# Patient Record
Sex: Male | Born: 1975 | Race: White | Hispanic: No | Smoking: Never smoker
Health system: Southern US, Community
[De-identification: ages and names within clinical notes are randomized; demographics above are authoritative.]

## PROBLEM LIST (undated history)

## (undated) DIAGNOSIS — T8612 Kidney transplant failure: Secondary | ICD-10-CM

## (undated) DIAGNOSIS — I10 Essential (primary) hypertension: Secondary | ICD-10-CM

## (undated) DIAGNOSIS — I509 Heart failure, unspecified: Secondary | ICD-10-CM

## (undated) HISTORY — PX: VALVE REPLACEMENT: SUR13

## (undated) HISTORY — PX: KIDNEY TRANSPLANT: SHX239

---

## 2020-05-09 ENCOUNTER — Emergency Department (HOSPITAL_COMMUNITY): Payer: Medicaid Other

## 2020-05-09 ENCOUNTER — Other Ambulatory Visit: Payer: Self-pay

## 2020-05-09 ENCOUNTER — Inpatient Hospital Stay (HOSPITAL_COMMUNITY)
Admission: EM | Admit: 2020-05-09 | Discharge: 2020-05-12 | DRG: 177 | Discharge status: Against Medical Advice | Payer: Medicaid Other | Attending: Internal Medicine | Admitting: Internal Medicine

## 2020-05-09 ENCOUNTER — Inpatient Hospital Stay (HOSPITAL_COMMUNITY): Payer: Medicaid Other

## 2020-05-09 ENCOUNTER — Encounter (HOSPITAL_COMMUNITY): Payer: Self-pay | Admitting: Emergency Medicine

## 2020-05-09 DIAGNOSIS — Q8781 Alport syndrome: Secondary | ICD-10-CM | POA: Diagnosis not present

## 2020-05-09 DIAGNOSIS — T380X5A Adverse effect of glucocorticoids and synthetic analogues, initial encounter: Secondary | ICD-10-CM | POA: Diagnosis not present

## 2020-05-09 DIAGNOSIS — I509 Heart failure, unspecified: Secondary | ICD-10-CM | POA: Diagnosis present

## 2020-05-09 DIAGNOSIS — N250 Renal osteodystrophy: Secondary | ICD-10-CM | POA: Diagnosis present

## 2020-05-09 DIAGNOSIS — Z825 Family history of asthma and other chronic lower respiratory diseases: Secondary | ICD-10-CM

## 2020-05-09 DIAGNOSIS — N179 Acute kidney failure, unspecified: Secondary | ICD-10-CM

## 2020-05-09 DIAGNOSIS — Z7901 Long term (current) use of anticoagulants: Secondary | ICD-10-CM

## 2020-05-09 DIAGNOSIS — Z5329 Procedure and treatment not carried out because of patient's decision for other reasons: Secondary | ICD-10-CM | POA: Diagnosis not present

## 2020-05-09 DIAGNOSIS — T8619 Other complication of kidney transplant: Secondary | ICD-10-CM | POA: Diagnosis present

## 2020-05-09 DIAGNOSIS — R739 Hyperglycemia, unspecified: Secondary | ICD-10-CM | POA: Diagnosis not present

## 2020-05-09 DIAGNOSIS — E875 Hyperkalemia: Secondary | ICD-10-CM | POA: Diagnosis present

## 2020-05-09 DIAGNOSIS — I132 Hypertensive heart and chronic kidney disease with heart failure and with stage 5 chronic kidney disease, or end stage renal disease: Secondary | ICD-10-CM | POA: Diagnosis present

## 2020-05-09 DIAGNOSIS — D631 Anemia in chronic kidney disease: Secondary | ICD-10-CM | POA: Diagnosis present

## 2020-05-09 DIAGNOSIS — E872 Acidosis: Secondary | ICD-10-CM | POA: Diagnosis present

## 2020-05-09 DIAGNOSIS — R569 Unspecified convulsions: Secondary | ICD-10-CM | POA: Diagnosis present

## 2020-05-09 DIAGNOSIS — J1282 Pneumonia due to coronavirus disease 2019: Secondary | ICD-10-CM | POA: Diagnosis present

## 2020-05-09 DIAGNOSIS — Z8711 Personal history of peptic ulcer disease: Secondary | ICD-10-CM

## 2020-05-09 DIAGNOSIS — N189 Chronic kidney disease, unspecified: Secondary | ICD-10-CM

## 2020-05-09 DIAGNOSIS — H905 Unspecified sensorineural hearing loss: Secondary | ICD-10-CM | POA: Diagnosis present

## 2020-05-09 DIAGNOSIS — Z888 Allergy status to other drugs, medicaments and biological substances status: Secondary | ICD-10-CM | POA: Diagnosis not present

## 2020-05-09 DIAGNOSIS — Y92239 Unspecified place in hospital as the place of occurrence of the external cause: Secondary | ICD-10-CM | POA: Diagnosis not present

## 2020-05-09 DIAGNOSIS — N186 End stage renal disease: Secondary | ICD-10-CM | POA: Diagnosis present

## 2020-05-09 DIAGNOSIS — Y830 Surgical operation with transplant of whole organ as the cause of abnormal reaction of the patient, or of later complication, without mention of misadventure at the time of the procedure: Secondary | ICD-10-CM | POA: Diagnosis present

## 2020-05-09 DIAGNOSIS — Z7982 Long term (current) use of aspirin: Secondary | ICD-10-CM

## 2020-05-09 DIAGNOSIS — Z79899 Other long term (current) drug therapy: Secondary | ICD-10-CM

## 2020-05-09 DIAGNOSIS — R0602 Shortness of breath: Secondary | ICD-10-CM

## 2020-05-09 DIAGNOSIS — Z952 Presence of prosthetic heart valve: Secondary | ICD-10-CM | POA: Diagnosis not present

## 2020-05-09 DIAGNOSIS — U071 COVID-19: Principal | ICD-10-CM | POA: Diagnosis present

## 2020-05-09 DIAGNOSIS — D649 Anemia, unspecified: Secondary | ICD-10-CM

## 2020-05-09 DIAGNOSIS — J9601 Acute respiratory failure with hypoxia: Secondary | ICD-10-CM | POA: Diagnosis present

## 2020-05-09 HISTORY — DX: Kidney transplant failure: T86.12

## 2020-05-09 HISTORY — DX: Heart failure, unspecified: I50.9

## 2020-05-09 HISTORY — DX: Essential (primary) hypertension: I10

## 2020-05-09 LAB — COMPREHENSIVE METABOLIC PANEL
ALT: 12 U/L (ref 0–44)
AST: 13 U/L — ABNORMAL LOW (ref 15–41)
Albumin: 3.5 g/dL (ref 3.5–5.0)
Alkaline Phosphatase: 36 U/L — ABNORMAL LOW (ref 38–126)
Anion gap: 13 (ref 5–15)
BUN: 90 mg/dL — ABNORMAL HIGH (ref 6–20)
CO2: 15 mmol/L — ABNORMAL LOW (ref 22–32)
Calcium: 7.3 mg/dL — ABNORMAL LOW (ref 8.9–10.3)
Chloride: 112 mmol/L — ABNORMAL HIGH (ref 98–111)
Creatinine, Ser: 3.73 mg/dL — ABNORMAL HIGH (ref 0.61–1.24)
GFR, Estimated: 20 mL/min — ABNORMAL LOW (ref >60)
Glucose, Bld: 103 mg/dL — ABNORMAL HIGH (ref 70–99)
Potassium: 5 mmol/L (ref 3.5–5.1)
Sodium: 140 mmol/L (ref 135–145)
Total Bilirubin: 0.9 mg/dL (ref 0.3–1.2)
Total Protein: 6.4 g/dL — ABNORMAL LOW (ref 6.5–8.1)

## 2020-05-09 LAB — ABO/RH: ABO/RH(D): A NEG

## 2020-05-09 LAB — CBC WITH DIFFERENTIAL/PLATELET
Abs Immature Granulocytes: 0.01 10*3/uL (ref 0.00–0.07)
Basophils Absolute: 0 10*3/uL (ref 0.0–0.1)
Basophils Relative: 0 %
Eosinophils Absolute: 0.3 10*3/uL (ref 0.0–0.5)
Eosinophils Relative: 6 %
HCT: 22.2 % — ABNORMAL LOW (ref 39.0–52.0)
Hemoglobin: 7.1 g/dL — ABNORMAL LOW (ref 13.0–17.0)
Immature Granulocytes: 0 %
Lymphocytes Relative: 12 %
Lymphs Abs: 0.6 10*3/uL — ABNORMAL LOW (ref 0.7–4.0)
MCH: 30.7 pg (ref 26.0–34.0)
MCHC: 32 g/dL (ref 30.0–36.0)
MCV: 96.1 fL (ref 80.0–100.0)
Monocytes Absolute: 0.5 10*3/uL (ref 0.1–1.0)
Monocytes Relative: 10 %
Neutro Abs: 3.7 10*3/uL (ref 1.7–7.7)
Neutrophils Relative %: 72 %
Platelets: 106 10*3/uL — ABNORMAL LOW (ref 150–400)
RBC: 2.31 MIL/uL — ABNORMAL LOW (ref 4.22–5.81)
RDW: 15.4 % (ref 11.5–15.5)
WBC: 5.2 10*3/uL (ref 4.0–10.5)
nRBC: 0 % (ref 0.0–0.2)

## 2020-05-09 LAB — PROCALCITONIN: Procalcitonin: 1.68 ng/mL

## 2020-05-09 LAB — RESP PANEL BY RT-PCR (FLU A&B, COVID) ARPGX2
Influenza A by PCR: NEGATIVE
Influenza B by PCR: NEGATIVE
SARS Coronavirus 2 by RT PCR: POSITIVE — AB

## 2020-05-09 LAB — FIBRINOGEN: Fibrinogen: 450 mg/dL (ref 210–475)

## 2020-05-09 LAB — TRIGLYCERIDES: Triglycerides: 80 mg/dL (ref <150)

## 2020-05-09 LAB — LACTATE DEHYDROGENASE: LDH: 258 U/L — ABNORMAL HIGH (ref 98–192)

## 2020-05-09 LAB — HIV ANTIBODY (ROUTINE TESTING W REFLEX): HIV Screen 4th Generation wRfx: NONREACTIVE

## 2020-05-09 LAB — PREPARE RBC (CROSSMATCH)

## 2020-05-09 LAB — LACTIC ACID, PLASMA: Lactic Acid, Venous: 0.9 mmol/L (ref 0.5–1.9)

## 2020-05-09 LAB — PROTIME-INR
INR: 4.4 (ref 0.8–1.2)
Prothrombin Time: 40.4 seconds — ABNORMAL HIGH (ref 11.4–15.2)

## 2020-05-09 LAB — D-DIMER, QUANTITATIVE: D-Dimer, Quant: 0.67 ug/mL-FEU — ABNORMAL HIGH (ref 0.00–0.50)

## 2020-05-09 LAB — FERRITIN: Ferritin: 449 ng/mL — ABNORMAL HIGH (ref 24–336)

## 2020-05-09 LAB — C-REACTIVE PROTEIN: CRP: 5.2 mg/dL — ABNORMAL HIGH (ref <1.0)

## 2020-05-09 MED ORDER — SODIUM CHLORIDE 0.9 % IV SOLN: 250.0000 mL | INTRAVENOUS | Status: DC | PRN

## 2020-05-09 MED ORDER — OXYCODONE HCL 5 MG PO TABS: 5.0000 mg | ORAL_TABLET | Freq: Three times a day (TID) | ORAL | Status: DC | PRN

## 2020-05-09 MED ORDER — BUMETANIDE 1 MG PO TABS: 1.0000 mg | ORAL_TABLET | Freq: Two times a day (BID) | ORAL | Status: DC

## 2020-05-09 MED ORDER — ASPIRIN EC 81 MG PO TBEC
81.0000 mg | DELAYED_RELEASE_TABLET | Freq: Every day | ORAL | Status: DC
  Administered 2020-05-09 – 2020-05-12 (×4): 81 mg via ORAL
  Filled 2020-05-09 (×4): qty 1

## 2020-05-09 MED ORDER — TACROLIMUS 1 MG PO CAPS
3.0000 mg | ORAL_CAPSULE | Freq: Two times a day (BID) | ORAL | Status: DC
  Administered 2020-05-09 – 2020-05-12 (×6): 3 mg via ORAL
  Filled 2020-05-09 (×6): qty 3

## 2020-05-09 MED ORDER — PANTOPRAZOLE SODIUM 20 MG PO TBEC
20.0000 mg | DELAYED_RELEASE_TABLET | Freq: Every day | ORAL | Status: DC
  Administered 2020-05-09 – 2020-05-12 (×4): 20 mg via ORAL
  Filled 2020-05-09 (×4): qty 1

## 2020-05-09 MED ORDER — ACETAMINOPHEN 325 MG PO TABS
650.0000 mg | ORAL_TABLET | Freq: Four times a day (QID) | ORAL | Status: DC | PRN
  Administered 2020-05-09 – 2020-05-10 (×3): 650 mg via ORAL
  Filled 2020-05-09 (×3): qty 2

## 2020-05-09 MED ORDER — SODIUM CHLORIDE 0.9 % IV SOLN: INTRAVENOUS | Status: DC

## 2020-05-09 MED ORDER — GUAIFENESIN-DM 100-10 MG/5ML PO SYRP: 10.0000 mL | ORAL_SOLUTION | ORAL | Status: DC | PRN

## 2020-05-09 MED ORDER — ATORVASTATIN CALCIUM 40 MG PO TABS
40.0000 mg | ORAL_TABLET | Freq: Every day | ORAL | Status: DC
  Administered 2020-05-09 – 2020-05-12 (×4): 40 mg via ORAL
  Filled 2020-05-09 (×4): qty 1

## 2020-05-09 MED ORDER — SODIUM CHLORIDE 0.9% IV SOLUTION: Freq: Once | INTRAVENOUS | Status: AC

## 2020-05-09 MED ORDER — SODIUM CHLORIDE 0.9 % IV SOLN
100.0000 mg | Freq: Every day | INTRAVENOUS | Status: DC
  Administered 2020-05-10 – 2020-05-12 (×3): 100 mg via INTRAVENOUS
  Filled 2020-05-09 (×4): qty 20

## 2020-05-09 MED ORDER — SODIUM CHLORIDE 0.9% FLUSH: 3.0000 mL | Freq: Two times a day (BID) | INTRAVENOUS | Status: DC

## 2020-05-09 MED ORDER — LEVETIRACETAM 500 MG PO TABS
500.0000 mg | ORAL_TABLET | Freq: Two times a day (BID) | ORAL | Status: DC
  Administered 2020-05-09 – 2020-05-12 (×6): 500 mg via ORAL
  Filled 2020-05-09 (×6): qty 1

## 2020-05-09 MED ORDER — SODIUM CHLORIDE 0.9 % IV SOLN
200.0000 mg | Freq: Once | INTRAVENOUS | Status: AC
  Administered 2020-05-09: 200 mg via INTRAVENOUS
  Filled 2020-05-09: qty 40

## 2020-05-09 MED ORDER — CARVEDILOL 12.5 MG PO TABS
12.5000 mg | ORAL_TABLET | Freq: Two times a day (BID) | ORAL | Status: DC
  Administered 2020-05-09 – 2020-05-12 (×6): 12.5 mg via ORAL
  Filled 2020-05-09 (×6): qty 1

## 2020-05-09 MED ORDER — ACETAMINOPHEN 500 MG PO TABS
1000.0000 mg | ORAL_TABLET | Freq: Once | ORAL | Status: AC
  Administered 2020-05-09: 1000 mg via ORAL
  Filled 2020-05-09: qty 2

## 2020-05-09 MED ORDER — WARFARIN - PHARMACIST DOSING INPATIENT: Freq: Every day | Status: DC

## 2020-05-09 MED ORDER — ONDANSETRON HCL 4 MG/2ML IJ SOLN
4.0000 mg | Freq: Four times a day (QID) | INTRAMUSCULAR | Status: AC | PRN
  Administered 2020-05-09 – 2020-05-10 (×3): 4 mg via INTRAVENOUS
  Filled 2020-05-09 (×2): qty 2

## 2020-05-09 MED ORDER — OXYCODONE HCL 5 MG PO TABS
5.0000 mg | ORAL_TABLET | Freq: Four times a day (QID) | ORAL | Status: DC | PRN
  Administered 2020-05-09 – 2020-05-10 (×4): 5 mg via ORAL
  Filled 2020-05-09 (×4): qty 1

## 2020-05-09 MED ORDER — CALCITRIOL 0.25 MCG PO CAPS
0.5000 ug | ORAL_CAPSULE | Freq: Every day | ORAL | Status: DC
  Administered 2020-05-09 – 2020-05-12 (×4): 0.5 ug via ORAL
  Filled 2020-05-09 (×2): qty 2
  Filled 2020-05-09: qty 1
  Filled 2020-05-09: qty 2

## 2020-05-09 MED ORDER — HYDROCOD POLST-CPM POLST ER 10-8 MG/5ML PO SUER: 5.0000 mL | Freq: Two times a day (BID) | ORAL | Status: DC | PRN

## 2020-05-09 MED ORDER — DEXAMETHASONE 6 MG PO TABS
6.0000 mg | ORAL_TABLET | ORAL | Status: DC
  Administered 2020-05-09 – 2020-05-11 (×3): 6 mg via ORAL
  Filled 2020-05-09 (×2): qty 1
  Filled 2020-05-09: qty 2

## 2020-05-09 MED ORDER — SODIUM CHLORIDE 0.9% FLUSH: 3.0000 mL | INTRAVENOUS | Status: DC | PRN

## 2020-05-09 NOTE — Consult Note (Signed)
Reason for Consult: Transplant kidney and renal failure Referring Physician: Dr. Joni Reining  Chief Complaint: Shortness of breath and body aches  Assessment/Plan: 1. AKI? 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 Dolores Frame. He reports that he's had fairly high cr but does not remember baseline cr. He believes it was in the 3's and actually had Bumex started bec of fluid retention. Doesn't appear that he's on Imuran or Cellcept and I wonder if he's had CMV or polyoma. - I've called [facility] and had the oncall nephrologist paged; will call again in the AM. - But for now I would fluid resuscitate with NS. - Continue steroids and Prograf; will check a tac level. - Urine studies. - Hold the Bumex. - Transplant renal ultrasound. 2. 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 3. Anemia - Transfuse as needed; no IV iron with active infection. 4. COVID PNA - per primary team. 5. H/o AVR   HPI: Wesley Bell is an 44 y.o. male with a history of renal failure currently on his 3rd transplant from [facility]) with the 1st and 3rd being DDRT and the 2nd a living transplant from his ex-wife. He was on dialysis since age 5 and also between the transplants with the the 2nd one in 2006 and the current one 5 years ago. He was on dialysis transiently after the 3rd transplant but does not report any history of rejection. He is compliant with his medications and is on prednisone 5mg , prograf 3mg  BID, and was also started on Bumex when he was retaining fluid. He has history of HTN, CHF and aortic valve replacement because of an aneurysm. 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 He has had a poor appetite as well + migraine headaches but has not noted decreased urine output, dysuria, nausea, diarrhea, hematuria.  ROS Pertinent items are noted in  HPI.  Chemistry and CBC: Creatinine, Ser  Date/Time Value Ref Range Status  05/09/2020 09:52 AM 3.73 (H) 0.61 - 1.24 mg/dL Final   Recent Labs  Lab 05/09/20 0952  NA 140  K 5.0  CL 112*  CO2 15*  GLUCOSE 103*  BUN 90*  CREATININE 3.73*  CALCIUM 7.3*   Recent Labs  Lab 05/09/20 0952  WBC 5.2  NEUTROABS 3.7  HGB 7.1*  HCT 22.2*  MCV 96.1  PLT 106*   Liver Function Tests: Recent Labs  Lab 05/09/20 0952  AST 13*  ALT 12  ALKPHOS 36*  BILITOT 0.9  PROT 6.4*  ALBUMIN 3.5   No results for input(s): LIPASE, AMYLASE in the last 168 hours. No results for input(s): AMMONIA in the last 168 hours. Cardiac Enzymes: No results for input(s): CKTOTAL, CKMB, CKMBINDEX, TROPONINI in the last 168 hours. Iron Studies:  Recent Labs    05/09/20 1255  FERRITIN 449*   PT/INR: @LABRCNTIP (inr:5)  Xrays/Other Studies: ) Results for orders placed or performed during the hospital encounter of 05/09/20 (from the past 48 hour(s))  Resp Panel by RT-PCR (Flu A&B, Covid) Nasopharyngeal Swab     Status: Abnormal   Collection Time: 05/09/20  9:51 AM   Specimen: Nasopharyngeal Swab; Nasopharyngeal(NP) swabs in vial transport medium  Result Value Ref Range   SARS Coronavirus 2 by RT PCR POSITIVE (A) NEGATIVE    Comment: emailed L. Kimball RN 11:45 05/09/20 ([email or web address]) (NOTE)  SARS-CoV-2 target nucleic acids are DETECTED.  The SARS-CoV-2 RNA is generally detectable in upper respiratory specimens during the acute phase of infection. Positive results are indicative of the presence of the identified virus, but do not rule out bacterial infection or co-infection with other pathogens not detected by the test. Clinical correlation with patient history and other diagnostic information is necessary to determine patient infection status. The expected result is Negative.  Fact Sheet for Patients: EntrepreneurPulse.com.au  Fact Sheet for Healthcare  Providers: IncredibleEmployment.be  This test is not yet approved or cleared by the [place] FDA and  has been authorized for detection and/or diagnosis of SARS-CoV-2 by FDA under an Emergency Use Authorization (EUA).  This EUA will remain in effect (meaning this test can be used) for the duration of  the COVID-1 9 declaration under Section 564(b)(1) of the Act, 21 U.S.C. section 360bbb-3(b)(1), unless the authorization is terminated or revoked sooner.     Influenza A by PCR NEGATIVE NEGATIVE   Influenza B by PCR NEGATIVE NEGATIVE    Comment: (NOTE) The Xpert Xpress SARS-CoV-2/FLU/RSV plus assay is intended as an aid in the diagnosis of influenza from Nasopharyngeal swab specimens and should not be used as a sole basis for treatment. Nasal washings and aspirates are unacceptable for Xpert Xpress SARS-CoV-2/FLU/RSV testing.  Fact Sheet for Patients: EntrepreneurPulse.com.au  Fact Sheet for Healthcare Providers: IncredibleEmployment.be  This test is not yet approved or cleared by the [place] FDA and has been authorized for detection and/or diagnosis of SARS-CoV-2 by FDA under an Emergency Use Authorization (EUA). This EUA will remain in effect (meaning this test can be used) for the duration of the COVID-19 declaration under Section 564(b)(1) of the Act, 21 U.S.C. section 360bbb-3(b)(1), unless the authorization is terminated or revoked.  Performed at [facility], [place] [address], [place], [place] [address]   Lactic acid, plasma     Status: None   Collection Time: 05/09/20  9:52 AM  Result Value Ref Range   Lactic Acid, Venous 0.9 0.5 - 1.9 mmol/L    Comment: Performed at [facility] [address] [place], [place] [address]  Comprehensive metabolic panel     Status: Abnormal   Collection Time: 05/09/20  9:52 AM  Result Value Ref Range   Sodium 140 135 - 145 mmol/L   Potassium 5.0 3.5 - 5.1  mmol/L   Chloride 112 (H) 98 - 111 mmol/L   CO2 15 (L) 22 - 32 mmol/L   Glucose, Bld 103 (H) 70 - 99 mg/dL    Comment: Glucose reference range applies only to samples taken after fasting for at least 8 hours.   BUN 90 (H) 6 - 20 mg/dL   Creatinine, Ser 3.73 (H) 0.61 - 1.24 mg/dL   Calcium 7.3 (L) 8.9 - 10.3 mg/dL   Total Protein 6.4 (L) 6.5 - 8.1 g/dL   Albumin 3.5 3.5 - 5.0 g/dL   AST 13 (L) 15 - 41 U/L   ALT 12 0 - 44 U/L   Alkaline Phosphatase 36 (L) 38 - 126 U/L   Total Bilirubin 0.9 0.3 - 1.2 mg/dL   GFR, Estimated 20 (L) >60 mL/min    Comment: (NOTE) Calculated using the CKD-EPI Creatinine Equation (2021)    Anion gap 13 5 - 15    Comment: Performed at [facility], [place] [address], [place], [place] [address]  CBC with Differential     Status: Abnormal   Collection Time: 05/09/20  9:52 AM  Result Value Ref Range   WBC 5.2 4.0 - 10.5 K/uL   RBC 2.31 (L) 4.22 - 5.81 MIL/uL   Hemoglobin 7.1 (L) 13.0 - 17.0 g/dL   HCT 22.2 (L) 39 - 52 %   MCV 96.1 80.0 - 100.0 fL   MCH 30.7 26.0 - 34.0 pg   MCHC 32.0 30.0 - 36.0 g/dL   RDW 15.4 11.5 - 15.5 %   Platelets 106 (L) 150 - 400 K/uL    Comment: REPEATED TO VERIFY PLATELET COUNT CONFIRMED BY SMEAR Immature Platelet Fraction may be clinically indicated, consider ordering this additional test [id number]    nRBC 0.0 0.0 - 0.2 %   Neutrophils Relative % 72 %   Neutro Abs 3.7 1.7 - 7.7 K/uL   Lymphocytes Relative 12 %   Lymphs Abs 0.6 (L) 0.7 - 4.0 K/uL   Monocytes Relative 10 %   Monocytes Absolute 0.5 0.1 - 1.0 K/uL   Eosinophils Relative 6 %   Eosinophils Absolute 0.3 0.0 - 0.5 K/uL   Basophils Relative 0 %   Basophils Absolute 0.0 0.0 - 0.1 K/uL   Immature Granulocytes 0 %   Abs Immature Granulocytes 0.01 0.00 - 0.07 K/uL   Schistocytes PRESENT     Comment: Performed at [facility], [place] [address], [place], [place] [address]  ABO/Rh     Status: None   Collection Time: 05/09/20  9:52 AM  Result Value Ref  Range   ABO/RH(D)      A NEG Performed at [facility] [address], [place], [place] [address]   D-dimer, quantitative     Status: Abnormal   Collection Time: 05/09/20 12:55 PM  Result Value Ref Range   D-Dimer, Quant 0.67 (H) 0.00 - 0.50 ug/mL-FEU    Comment: (NOTE) At the manufacturer cut-off value of 0.5 g/mL FEU, this assay has a negative predictive value of 95-100%.This assay is intended for use in conjunction with a clinical pretest probability (PTP) assessment model to exclude pulmonary embolism (PE) and deep venous thrombosis (DVT) in outpatients suspected of PE or DVT. Results should be correlated with clinical presentation. Performed at [facility], [place] [address] [place], [place] [address] CORRECTED ON 12/07 AT 1444: PREVIOUSLY REPORTED AS 0.67   Procalcitonin     Status: None   Collection Time: 05/09/20 12:55 PM  Result Value Ref Range   Procalcitonin 1.68 ng/mL    Comment:        Interpretation: PCT > 0.5 ng/mL and <= 2 ng/mL: Systemic infection (sepsis) is possible, but other conditions are known to elevate PCT as well. (NOTE)       Sepsis PCT Algorithm           Lower Respiratory Tract                                      Infection PCT Algorithm    ----------------------------     ----------------------------         PCT < 0.25 ng/mL                PCT < 0.10 ng/mL          Strongly encourage             Strongly discourage   discontinuation of antibiotics    initiation of antibiotics    ----------------------------     -----------------------------  PCT 0.25 - 0.50 ng/mL            PCT 0.10 - 0.25 ng/mL               OR       >80% decrease in PCT            Discourage initiation of                                            antibiotics      Encourage discontinuation           of antibiotics    ----------------------------     -----------------------------         PCT >= 0.50 ng/mL              PCT 0.26 - 0.50 ng/mL                 AND       <80% decrease in PCT             Encourage initiation of                                             antibiotics       Encourage continuation           of antibiotics    ----------------------------     -----------------------------        PCT >= 0.50 ng/mL                  PCT > 0.50 ng/mL               AND         increase in PCT                  Strongly encourage                                      initiation of antibiotics    Strongly encourage escalation           of antibiotics                                     -----------------------------                                           PCT <= 0.25 ng/mL                                                 OR                                        > 80% decrease in PCT  Discontinue / Do not initiate                                             antibiotics  Performed at [facility], [place] [address], [place], [place] [address]   Lactate dehydrogenase     Status: Abnormal   Collection Time: 05/09/20 12:55 PM  Result Value Ref Range   LDH 258 (H) 98 - 192 U/L    Comment: Performed at [facility], [place] [address], [place], [place] [address]  Ferritin     Status: Abnormal   Collection Time: 05/09/20 12:55 PM  Result Value Ref Range   Ferritin 449 (H) 24 - 336 ng/mL    Comment: Performed at [facility], [place] [address] [place], [place] [address]  Triglycerides     Status: None   Collection Time: 05/09/20 12:55 PM  Result Value Ref Range   Triglycerides 80 <150 mg/dL    Comment: Performed at [facility] [address], [place], [place] [address]  Fibrinogen     Status: None   Collection Time: 05/09/20 12:55 PM  Result Value Ref Range   Fibrinogen 450 210 - 475 mg/dL    Comment: Performed at [facility] [address] [place], [place] [address]  C-reactive protein     Status: Abnormal   Collection Time: 05/09/20 12:55 PM  Result Value Ref Range   CRP  5.2 (H) <1.0 mg/dL    Comment: Performed at [facility], [place] [address], [place], [place] [address]  Type and screen [place]     Status: None (Preliminary result)   Collection Time: 05/09/20  1:15 PM  Result Value Ref Range   ABO/RH(D) A NEG    Antibody Screen NEG    Sample Expiration 05/12/2020,2359    Unit Number [id number]    Blood Component Type RED CELLS,LR    Unit division 00    Status of Unit ISSUED    Transfusion Status OK TO TRANSFUSE    Crossmatch Result      Compatible Performed at [facility], [place] [address], [place],  [address]    Unit Number [id number]    Blood Component Type RED CELLS,LR    Unit division 00    Status of Unit ALLOCATED    Transfusion Status OK TO TRANSFUSE    Crossmatch Result Compatible   Prepare RBC (crossmatch)     Status: None   Collection Time: 05/09/20  1:15 PM  Result Value Ref Range   Order Confirmation      ORDER PROCESSED BY BLOOD BANK Performed at [facility], [place] [address], [place],  [address]    DG Chest 1 View  Result Date: 05/09/2020 CLINICAL DATA:  Shortness of breath, weakness, cough. EXAM: CHEST  1 VIEW COMPARISON:  None. FINDINGS: Bilateral patchy pulmonary opacities. No pneumothorax or pleural effusion. Postsurgical appearance of an enlarged cardiac silhouette. No acute osseous abnormality. Radiodensity overlying the right upper lung may reflect a vascular graft. IMPRESSION: Bilateral pulmonary opacities concerning for infection. Cardiomegaly. Electronically Signed   By: Primitivo Gauze M.D.   On: 05/09/2020 11:07    PMH:   Past Medical History:  Diagnosis Date  . CHF (congestive heart failure) ([place])   . Hypertension     PSH:  Past Surgical History:  Procedure Laterality Date  . KIDNEY TRANSPLANT    . VALVE REPLACEMENT      Allergies:  Allergies  Allergen Reactions  . Zosyn [Piperacillin Sod-Tazobactam So]     Itching     Medications:    Prior to Admission medications   Medication Sig Start Date End Date Taking? Authorizing Provider  ASPIRIN LOW DOSE 81 MG EC tablet Take 81 mg by mouth daily. 04/24/20   [provider]  atorvastatin (LIPITOR) 40 MG tablet Take 40 mg by mouth daily. 04/24/20   [provider]  bumetanide (BUMEX) 1 MG tablet Take 1 mg by mouth 2 (two) times daily. 04/24/20   [provider]  calcitRIOL (ROCALTROL) 0.5 MCG capsule Take 0.5 mcg by mouth daily. 04/24/20   [provider]  CALCIUM ANTACID 500 MG chewable tablet Chew 3 tablets by mouth 3 (three) times daily as needed. 04/24/20   [provider]  lansoprazole (PREVACID) 30 MG capsule Take 30 mg by mouth daily. 04/24/20   [provider]  levETIRAcetam (KEPPRA) 500 MG tablet Take 500 mg by mouth 2 (two) times daily. 04/24/20   [provider]  predniSONE (DELTASONE) 5 MG tablet Take 5 mg by mouth daily. 04/24/20   [provider]  tacrolimus (PROGRAF) 1 MG capsule Take 3 mg by mouth 2 (two) times daily. 04/24/20   [provider]  Vitamin D, Ergocalciferol, (DRISDOL) 1.25 MG (50000 UNIT) CAPS capsule Take 50,000 Units by mouth once a week. 04/24/20   [provider]    Discontinued Meds:   Medications Discontinued During This Encounter  Medication Reason  . sodium chloride flush (NS) 0.9 % injection 3 mL   . sodium chloride flush (NS) 0.9 % injection 3 mL   . 0.9 %  sodium chloride infusion     Social History:  has no history on file for tobacco use, alcohol use, and drug use.  Family History:  No family history on file.  Blood pressure (!) 197/64, pulse 83, temperature 97.7 F (36.5 C), temperature source Oral, resp. rate (!) 28, height 6' (1.829 m), weight 65.8 kg, SpO2 100 %. General appearance: alert, cooperative and appears stated age Head: Normocephalic, without obvious abnormality, atraumatic Eyes: negative Neck: no adenopathy, no carotid bruit,  no JVD, supple, symmetrical, trachea midline and thyroid not enlarged, symmetric, no tenderness/mass/nodules Back: symmetric, no curvature. ROM normal. No CVA tenderness. Resp: rhonchi bibasilar and bilaterally Chest wall: no tenderness Cardio: regular rate and rhythm GI: soft, non-tender; bowel sounds normal; no masses,  no organomegaly and RLQ transplant kidney very  minimally tender, no calor Extremities: extremities normal, atraumatic, no cyanosis or edema Pulses: 2+ and symmetric Skin: Skin color, texture, turgor normal. No rashes or lesions Lymph nodes: Cervical adenopathy: none       Celisse Ciulla, Hunt Oris, MD 05/09/2020, 5:28 PM

## 2020-05-09 NOTE — ED Notes (Signed)
[facility]: [phone] Call for medical records Dr. Dolores Frame (nephrologist)

## 2020-05-09 NOTE — H&P (Signed)
Date: 05/09/2020               Patient Name:  Wesley Bell MRN: [id number]  DOB: 1976/01/04 Age / Sex: 44 y.o., male   PCP: Pcp, No         Medical Service: Internal Medicine Teaching Service         Attending Physician: Dr. Noemi Chapel, MD    First Contact: Dr. Linwood Dibbles, MD Pager: [phone]  Second Contact: Dr. Marianna Payment, DO Pager: [phone]       After Hours (After 5p/  First Contact Pager: [phone]  weekends / holidays): Second Contact Pager: [phone]   Chief Complaint: 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 Over the weekend, he had some nausea and vomited once. His truck broke down in [place] so he did not eat for 10 hours. He was able to get back on the road and last night, he started feeling SOB, DOE, hot and cold. This morning, he started having migraine headache and blurry vision so he called his boss who told him to come to the nearest ED. He endorse myalgias, dizziness, dry cough, poor appetite, chest discomfort but denied any abd pain or back pain.   Off note, patient reports he has had similar symptoms in the past and has been hospitalized multiple times, including multiple hospitalizations requiring intubation.    ED course: Found to have SpO2 of 88% on room air. Improved to 97% on 4L. Patient tested positive for covid in the ED.Patient has received the J&J but has not receive a booster. CXR showed covid pneumonia. Hgb 7.1  BUN is 90 and creat 3.73. He received 1 unit of pRBC.   Meds:  No outpatient medications have been marked as taking for the 05/09/20 encounter [facility] Encounter).  Aspirin 81 mg  daily Atorvastatin 40 mg daily Bumex 1 mg twice daily Calcitrol 0.5 mcg daily Keppra 500 mg twice daily Prednisone 5 mg daily Prograf 3 mg twice daily Vitamin D 50,000 units once a week Lansoprazole 30 mg daily   Allergies: Allergies as of 05/09/2020 - Review Complete 05/09/2020  Allergen Reaction Noted  . Zosyn [piperacillin sod-tazobactam so]  05/09/2020   Past Medical History:  Diagnosis Date  . CHF (congestive heart failure) ([place])   . 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 His brother passed away from Alport at the age of 32. Hx of COPD in father.   Social History: Works as a Therapist, nutritional. Patient is divorced. He denies any tobacco, alcohol or illicit drug use.   Review of Systems: A complete ROS was negative except as per HPI.  This  Physical Exam: Blood pressure (!) 141/62, pulse 91, temperature 98.6 F (37 C), temperature source Oral, resp. rate (!) 32, height 6' (1.829 m),  weight 65.8 kg, SpO2 96 %.  General: Pleasant, mildly cachetic middle-age laying in bed. No acute distress. Appears older than stated age.  Head: Normocephalic. Atraumatic. HEENT: Sensorineural hearing loss. Has hearing aid.  CV: RRR. No rubs, or gallops. Systolic flow murmur. No LE edema Pulmonary: Lungs CTAB. Mild ttp palpation of the anterior wall. Normal effort. No wheezing or rales. Abdominal: Soft, nontender, nondistended. Normal bowel sounds. Extremities: Palpable pulses.  Normal ROM. Skin: Warm and dry. No obvious rash or lesions. Neuro: A&Ox3. Moves all extremities. Normal sensation. No focal deficit. Psych: Normal mood and affect  EKG: personally reviewed my interpretation is Sinus tach possible LVH  CXR: personally reviewed my interpretation is bilateral pulmonary opacities consistent with covid pneumonia. Cardiomegaly.  Assessment & Plan by Problem: Active Problems:   Pneumonia due to COVID-19 virus  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   #Acute respiratory failure 2/2 COVID pneumonia Patient w/ dyspnea, body aches, cough, fever/chills and found to be in acute hypoxic respiratory failure, currently requiring 4L oxygen. Inflammatory markers elevated at CRP 5.2, D-dimer 0.67, ferritin 449, LDH 258 . Pro-calcitonin of 1.68. Normal lactic acid, fibrinogen, trig. Unvaccinated status. COVID + on 12/07. CXR shows bilateral pulmonary opacities. Need admission for treatment for COVID pneumonia. Currently on 2L with sats above 96%. Continue to monitor.  --Start remdesivir (day 1/5) --Start dexamethasone (day 1/10) --Robitussin-DM 10 mL q4h prn for cough --Tussionex 5 mL q12h prn for cough --Trend CMP, inflammatory markers --F/u bcx --Daily CBC --O2 as need to keep >88 --Early ambulation, proning as tolerated, incentive spirometry --SSI while on steroids  #ESRD s/p kidney transplant x3 Secondary to Alport syndrome. Patient received kidney transplant in 2001 & 2006 and both failed. His third transplant in 2016 has successful so far. Does not remember baseline cr. Found to have BUN of 90 and creat of 3.73.  --Nephro consulted, appreciate recs --Prednisone 5mg   daily --F/u transplant renal U/S --IVNS @ 125 mL/hr --Calcitriol 0.5 mcg daily --Holding Bumex --Prograf 3 mg BID --F/u tac levels --Urine studies. --Daily BMP  #mechanical AVR on Coumadin Patient states he had his aortic valve replaced in 2012.  He is currently on warfarin.  And takes 7.5 mg on Monday, Wednesday and Saturday then 3 mg the rest of the week. --Pharm consult for coumadin dosage --F/u PT/INR --Aspirin 81 mg daily --Lipitor 40 mg daily  #HTN Patient was normotensive on arrival. BP continues to rise since admission.  Will restart home meds and monitor. --Coreg 12.5 mg twice daily  #Seizure Patient with a history of seizure on Keppra.  Stable --Keppra 500 mg twice daily  #Pain Patient endorses mild chest discomfort and generalized pain. --Tylenol 650 mg q6h prn for mild pain --Oxycodone 5 mg q6h prn for mod pain  CODE STATUS: Full Code DIET: Renal diet PPx: Warfarin  Dispo: Admit patient to Inpatient with expected length of stay greater than 2 midnights.  Signed: Lacinda Axon, MD 05/09/2020, 2:01 PM  Pager: [phone] Internal Medicine Teaching Service After 5pm on weekdays and 1pm on weekends: On Call pager: [phone]

## 2020-05-09 NOTE — ED Provider Notes (Signed)
[facility] EMERGENCY DEPARTMENT Provider Note   CSN: [id number] Arrival date & time: 05/09/20  4098     History Chief Complaint  Patient presents with  . Shortness of Breath    Wesley Bell is a 44 y.o. male.  The history is provided by the patient. No language interpreter was used.  Shortness of Breath Severity:  Severe Onset quality:  Gradual Duration:  3 days Timing:  Intermittent Progression:  Worsening Chronicity:  New Relieved by:  Nothing Worsened by:  Nothing Ineffective treatments:  None tried Associated symptoms: cough and fever   Pt complains of a cough and shortness of breath.  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  Pt has had a renal transplant.  Pt also has a history of a valve replacement and has congestive heart failure    Past Medical History:  Diagnosis Date  . CHF (congestive heart failure) ([place])   . Hypertension     There are no problems to display for this patient.   Past Surgical History:  Procedure Laterality Date  . KIDNEY TRANSPLANT    . VALVE REPLACEMENT         No family history on file.  Social History   Tobacco Use  . Smoking status: Not on file  Substance Use Topics  . Alcohol use: Not on file  . Drug use: Not on file    Home Medications Prior to Admission medications   Not on File    Allergies    Patient has no known allergies.  Review of Systems   Review of Systems  Constitutional: Positive for fever.  Respiratory: Positive for cough and shortness of breath.   All other systems reviewed and are negative.   Physical Exam Updated Vital Signs BP (!) 124/53   Pulse 94   Temp 98.6 F (37 C) (Oral)   Resp 18   SpO2 100%   Physical Exam Vitals and nursing note reviewed.  Constitutional:      Appearance: He is well-developed. He is ill-appearing.  HENT:     Head: Normocephalic.  Cardiovascular:     Heart sounds: Murmur heard.   Pulmonary:     Effort: Pulmonary effort is normal.      Breath sounds: No decreased breath sounds.  Chest:     Chest wall: No tenderness.  Abdominal:     General: There is no distension.  Musculoskeletal:        General: Normal range of motion.     Cervical back: Normal range of motion.  Skin:    Coloration: Skin is pale.  Neurological:     Mental Status: He is alert and oriented to person, place, and time.  Psychiatric:        Mood and Affect: Mood normal.     ED Results / Procedures / Treatments   Labs (all labs ordered are listed, but only abnormal results are displayed) Labs Reviewed  RESP PANEL BY RT-PCR (FLU A&B, COVID) ARPGX2 - Abnormal; Notable for the following components:      Result Value   SARS Coronavirus 2 by RT PCR POSITIVE (*)    All other components within normal limits  COMPREHENSIVE METABOLIC PANEL - Abnormal; Notable for the following components:   Chloride 112 (*)    CO2 15 (*)    Glucose, Bld 103 (*)    BUN 90 (*)    Creatinine, Ser 3.73 (*)    Calcium 7.3 (*)    Total Protein 6.4 (*)  AST 13 (*)    Alkaline Phosphatase 36 (*)    GFR, Estimated 20 (*)    All other components within normal limits  CBC WITH DIFFERENTIAL/PLATELET - Abnormal; Notable for the following components:   RBC 2.31 (*)    Hemoglobin 7.1 (*)    HCT 22.2 (*)    Platelets 106 (*)    Lymphs Abs 0.6 (*)    All other components within normal limits  CULTURE, BLOOD (ROUTINE X 2)  CULTURE, BLOOD (ROUTINE X 2)  LACTIC ACID, PLASMA  URINALYSIS, ROUTINE W REFLEX MICROSCOPIC  D-DIMER, QUANTITATIVE (NOT AT [facility])  PROCALCITONIN  LACTATE DEHYDROGENASE  FERRITIN  TRIGLYCERIDES  FIBRINOGEN  C-REACTIVE PROTEIN    EKG None  Radiology DG Chest 1 View  Result Date: 05/09/2020 CLINICAL DATA:  Shortness of breath, weakness, cough. EXAM: CHEST  1 VIEW COMPARISON:  None. FINDINGS: Bilateral patchy pulmonary opacities. No pneumothorax or pleural effusion. Postsurgical appearance of an enlarged cardiac silhouette. No acute osseous  abnormality. Radiodensity overlying the right upper lung may reflect a vascular graft. IMPRESSION: Bilateral pulmonary opacities concerning for infection. Cardiomegaly. Electronically Signed   By: Primitivo Gauze M.D.   On: 05/09/2020 11:07    Procedures Procedures (including critical care time)  Medications Ordered in ED Medications  sodium chloride flush (NS) 0.9 % injection 3 mL (has no administration in time range)  sodium chloride flush (NS) 0.9 % injection 3 mL (has no administration in time range)  0.9 %  sodium chloride infusion (has no administration in time range)  acetaminophen (TYLENOL) tablet 1,000 mg (1,000 mg Oral Given 05/09/20 1005)    ED Course  I have reviewed the triage vital signs and the nursing notes.  Pertinent labs & imaging results that were available during my care of the patient were reviewed by me and considered in my medical decision making (see chart for details).    MDM Rules/Calculators/A&P                          MDM Pt is covid positive  Chest xray shows pneumonia.  Hemoglobin is 7.1  BUN is 90 and creat 3.73.  02 sat 88 on room air  97 on 4 liters.   Pt's records not in care everywhere.  I will see if we can obtain Internal medicaine consulted for admission   Nephrology paged for consult  Final Clinical Impression(s) / ED Diagnoses Final diagnoses:  Pneumonia due to COVID-19 virus  Anemia, unspecified type    Rx / DC Orders ED Discharge Orders    None       Sidney Ace 05/09/20 1414    Veryl Speak, MD 05/10/20 2224

## 2020-05-09 NOTE — ED Triage Notes (Signed)
Pt with shortness of breath, generalized body aches, and cough since last night. Pt is a long distance truck driver. SpO2 room air was 88%, with improvement to 97% on 4L. Febrile with EMS.

## 2020-05-09 NOTE — Progress Notes (Signed)
ANTICOAGULATION CONSULT NOTE - Initial Consult  Pharmacy Consult for warfarin Indication: Mechanical AVR  Allergies  Allergen Reactions  . Zosyn [Piperacillin Sod-Tazobactam So]     Itching     Patient Measurements: Height: 6' (182.9 cm) Weight: 65.8 kg (145 lb) IBW/kg (Calculated) : 77.6 Heparin Dosing Weight: 65.8 kg   Vital Signs: Temp: 97.7 F (36.5 C) (12/07 1531) Temp Source: Oral (12/07 1531) BP: 197/64 (12/07 1531) Pulse Rate: 83 (12/07 1531)  Labs: Recent Labs    05/09/20 0952  HGB 7.1*  HCT 22.2*  PLT 106*  CREATININE 3.73*    Estimated Creatinine Clearance: 23.8 mL/min (A) (by C-G formula based on SCr of 3.73 mg/dL (H)).   Medical History: Past Medical History:  Diagnosis Date  . CHF (congestive heart failure) ([place])   . Hypertension     Medications:  (Not in a hospital admission)   Assessment: 77 YOM with SOB secondary to COVID-19 on warfarin PTA for h/o of mechanical AVR. Pharmacy consulted to resume warfarin therapy.   H/H low, Plt low. SCr 3.73. INR supratherapeutic at 4.4  Goal of Therapy:  INR 2-3 Monitor platelets by anticoagulation protocol: Yes   Plan:  -Hold warfarin  -Monitor daily INR -Monitor for s/s of bleeding   Albertina Parr, PharmD., BCPS, BCCCP Clinical Pharmacist Please refer to [facility] for unit-specific pharmacist

## 2020-05-09 NOTE — ED Notes (Signed)
EDP made aware that pt requesting pain medication

## 2020-05-10 LAB — TYPE AND SCREEN
ABO/RH(D): A NEG
Antibody Screen: NEGATIVE
Unit division: 0
Unit division: 0

## 2020-05-10 LAB — COMPREHENSIVE METABOLIC PANEL
ALT: 19 U/L (ref 0–44)
AST: 30 U/L (ref 15–41)
Albumin: 3 g/dL — ABNORMAL LOW (ref 3.5–5.0)
Alkaline Phosphatase: 34 U/L — ABNORMAL LOW (ref 38–126)
Anion gap: 11 (ref 5–15)
BUN: 90 mg/dL — ABNORMAL HIGH (ref 6–20)
CO2: 15 mmol/L — ABNORMAL LOW (ref 22–32)
Calcium: 6.5 mg/dL — ABNORMAL LOW (ref 8.9–10.3)
Chloride: 111 mmol/L (ref 98–111)
Creatinine, Ser: 4.11 mg/dL — ABNORMAL HIGH (ref 0.61–1.24)
GFR, Estimated: 18 mL/min — ABNORMAL LOW (ref >60)
Glucose, Bld: 91 mg/dL (ref 70–99)
Potassium: >7.5 mmol/L (ref 3.5–5.1)
Sodium: 137 mmol/L (ref 135–145)
Total Bilirubin: 1.2 mg/dL (ref 0.3–1.2)
Total Protein: 5.9 g/dL — ABNORMAL LOW (ref 6.5–8.1)

## 2020-05-10 LAB — URINALYSIS, ROUTINE W REFLEX MICROSCOPIC
Bacteria, UA: NONE SEEN
Bilirubin Urine: NEGATIVE
Glucose, UA: NEGATIVE mg/dL
Ketones, ur: NEGATIVE mg/dL
Leukocytes,Ua: NEGATIVE
Nitrite: NEGATIVE
Protein, ur: NEGATIVE mg/dL
Specific Gravity, Urine: 1.008 (ref 1.005–1.030)
pH: 5 (ref 5.0–8.0)

## 2020-05-10 LAB — CBC WITH DIFFERENTIAL/PLATELET
Abs Immature Granulocytes: 0.02 10*3/uL (ref 0.00–0.07)
Basophils Absolute: 0 10*3/uL (ref 0.0–0.1)
Basophils Relative: 1 %
Eosinophils Absolute: 0.3 10*3/uL (ref 0.0–0.5)
Eosinophils Relative: 6 %
HCT: 23.8 % — ABNORMAL LOW (ref 39.0–52.0)
Hemoglobin: 7.9 g/dL — ABNORMAL LOW (ref 13.0–17.0)
Immature Granulocytes: 0 %
Lymphocytes Relative: 12 %
Lymphs Abs: 0.6 10*3/uL — ABNORMAL LOW (ref 0.7–4.0)
MCH: 30.5 pg (ref 26.0–34.0)
MCHC: 33.2 g/dL (ref 30.0–36.0)
MCV: 91.9 fL (ref 80.0–100.0)
Monocytes Absolute: 0.6 10*3/uL (ref 0.1–1.0)
Monocytes Relative: 12 %
Neutro Abs: 3.3 10*3/uL (ref 1.7–7.7)
Neutrophils Relative %: 69 %
Platelets: 93 10*3/uL — ABNORMAL LOW (ref 150–400)
RBC: 2.59 MIL/uL — ABNORMAL LOW (ref 4.22–5.81)
RDW: 17.2 % — ABNORMAL HIGH (ref 11.5–15.5)
WBC: 4.8 10*3/uL (ref 4.0–10.5)
nRBC: 0 % (ref 0.0–0.2)

## 2020-05-10 LAB — BASIC METABOLIC PANEL
Anion gap: 10 (ref 5–15)
Anion gap: 12 (ref 5–15)
BUN: 95 mg/dL — ABNORMAL HIGH (ref 6–20)
BUN: 97 mg/dL — ABNORMAL HIGH (ref 6–20)
CO2: 17 mmol/L — ABNORMAL LOW (ref 22–32)
CO2: 19 mmol/L — ABNORMAL LOW (ref 22–32)
Calcium: 6.4 mg/dL — CL (ref 8.9–10.3)
Calcium: 6.8 mg/dL — ABNORMAL LOW (ref 8.9–10.3)
Chloride: 110 mmol/L (ref 98–111)
Chloride: 107 mmol/L (ref 98–111)
Creatinine, Ser: 4.01 mg/dL — ABNORMAL HIGH (ref 0.61–1.24)
Creatinine, Ser: 4.27 mg/dL — ABNORMAL HIGH (ref 0.61–1.24)
GFR, Estimated: 18 mL/min — ABNORMAL LOW (ref >60)
GFR, Estimated: 17 mL/min — ABNORMAL LOW (ref >60)
Glucose, Bld: 107 mg/dL — ABNORMAL HIGH (ref 70–99)
Glucose, Bld: 117 mg/dL — ABNORMAL HIGH (ref 70–99)
Potassium: 6.1 mmol/L — ABNORMAL HIGH (ref 3.5–5.1)
Potassium: 6.4 mmol/L (ref 3.5–5.1)
Sodium: 137 mmol/L (ref 135–145)
Sodium: 138 mmol/L (ref 135–145)

## 2020-05-10 LAB — D-DIMER, QUANTITATIVE: D-Dimer, Quant: 0.62 ug/mL-FEU — ABNORMAL HIGH (ref 0.00–0.50)

## 2020-05-10 LAB — BPAM RBC
Blood Product Expiration Date: 202112172359
Blood Product Expiration Date: 202112182359
ISSUE DATE / TIME: 202112071502
ISSUE DATE / TIME: 202112071803
Unit Type and Rh: 600
Unit Type and Rh: 600

## 2020-05-10 LAB — PHOSPHORUS: Phosphorus: 5.8 mg/dL — ABNORMAL HIGH (ref 2.5–4.6)

## 2020-05-10 LAB — PROTIME-INR
INR: 4.5 (ref 0.8–1.2)
Prothrombin Time: 41.2 seconds — ABNORMAL HIGH (ref 11.4–15.2)

## 2020-05-10 LAB — C-REACTIVE PROTEIN: CRP: 12.2 mg/dL — ABNORMAL HIGH (ref <1.0)

## 2020-05-10 LAB — CREATININE, URINE, RANDOM: Creatinine, Urine: 45.99 mg/dL

## 2020-05-10 LAB — FERRITIN: Ferritin: 652 ng/mL — ABNORMAL HIGH (ref 24–336)

## 2020-05-10 LAB — MAGNESIUM: Magnesium: 1.7 mg/dL (ref 1.7–2.4)

## 2020-05-10 LAB — SODIUM, URINE, RANDOM: Sodium, Ur: 112 mmol/L

## 2020-05-10 MED ORDER — INSULIN ASPART 100 UNIT/ML IV SOLN: 5.0000 [IU] | Freq: Once | INTRAVENOUS | Status: DC

## 2020-05-10 MED ORDER — SODIUM ZIRCONIUM CYCLOSILICATE 10 G PO PACK
10.0000 g | PACK | Freq: Once | ORAL | Status: AC
  Administered 2020-05-10: 10 g via ORAL

## 2020-05-10 MED ORDER — DEXTROSE 50 % IV SOLN
25.0000 g | Freq: Once | INTRAVENOUS | Status: AC
  Administered 2020-05-10: 25 g via INTRAVENOUS
  Filled 2020-05-10: qty 50

## 2020-05-10 MED ORDER — SODIUM ZIRCONIUM CYCLOSILICATE 5 G PO PACK
5.0000 g | PACK | Freq: Once | ORAL | Status: DC
  Filled 2020-05-10: qty 1

## 2020-05-10 MED ORDER — SODIUM BICARBONATE 8.4 % IV SOLN
100.0000 meq | INTRAVENOUS | Status: AC
  Administered 2020-05-10: 100 meq via INTRAVENOUS
  Filled 2020-05-10: qty 50

## 2020-05-10 MED ORDER — ONDANSETRON HCL 4 MG/2ML IJ SOLN
4.0000 mg | Freq: Four times a day (QID) | INTRAMUSCULAR | Status: DC
  Administered 2020-05-11 – 2020-05-12 (×3): 4 mg via INTRAVENOUS
  Filled 2020-05-10 (×4): qty 2

## 2020-05-10 MED ORDER — INSULIN ASPART 100 UNIT/ML IV SOLN: 10.0000 [IU] | Freq: Once | INTRAVENOUS | Status: DC

## 2020-05-10 MED ORDER — ALBUTEROL SULFATE (2.5 MG/3ML) 0.083% IN NEBU
10.0000 mg | INHALATION_SOLUTION | Freq: Once | RESPIRATORY_TRACT | Status: DC
  Filled 2020-05-10: qty 12

## 2020-05-10 MED ORDER — SODIUM CHLORIDE 0.9 % IV SOLN: INTRAVENOUS | Status: DC

## 2020-05-10 MED ORDER — FUROSEMIDE 10 MG/ML IJ SOLN
80.0000 mg | Freq: Once | INTRAMUSCULAR | Status: AC
  Administered 2020-05-10: 80 mg via INTRAVENOUS
  Filled 2020-05-10: qty 8

## 2020-05-10 MED ORDER — ONDANSETRON HCL 4 MG/2ML IJ SOLN
4.0000 mg | Freq: Four times a day (QID) | INTRAMUSCULAR | Status: DC | PRN
  Administered 2020-05-10 – 2020-05-11 (×2): 4 mg via INTRAVENOUS
  Filled 2020-05-10: qty 2

## 2020-05-10 MED ORDER — SODIUM BICARBONATE 8.4 % IV SOLN
50.0000 meq | INTRAVENOUS | Status: AC
  Administered 2020-05-10: 50 meq via INTRAVENOUS
  Filled 2020-05-10: qty 50

## 2020-05-10 MED ORDER — CALCIUM GLUCONATE-NACL 1-0.675 GM/50ML-% IV SOLN
1.0000 g | Freq: Once | INTRAVENOUS | Status: AC
  Administered 2020-05-10: 1000 mg via INTRAVENOUS
  Filled 2020-05-10: qty 50

## 2020-05-10 MED ORDER — MAGNESIUM SULFATE 2 GM/50ML IV SOLN
2.0000 g | Freq: Once | INTRAVENOUS | Status: AC
  Administered 2020-05-10: 2 g via INTRAVENOUS
  Filled 2020-05-10: qty 50

## 2020-05-10 MED ORDER — SODIUM ZIRCONIUM CYCLOSILICATE 10 G PO PACK
10.0000 g | PACK | Freq: Once | ORAL | Status: AC
  Administered 2020-05-10: 10 g via ORAL
  Filled 2020-05-10: qty 1

## 2020-05-10 MED ORDER — INSULIN ASPART 100 UNIT/ML IV SOLN
5.0000 [IU] | INTRAVENOUS | Status: AC
  Administered 2020-05-10: 5 [IU] via INTRAVENOUS

## 2020-05-10 NOTE — Progress Notes (Addendum)
Paged by Danie Binder for patient's K 7.5. No hemolysis seen on labs. Ordered STAT EKG, calcium gluconate 1g IV, albuterol nebs, insulin with dextrose, and lokelma 5g. Contacted nephrology (Dr. Royce Macadamia), she will place additional dose of 1g IV calcium gluconate, lasix 80mg , and bicarb x2. Nephrology will decide if patient will need dialysis.

## 2020-05-10 NOTE — Progress Notes (Signed)
CRITICAL VALUE ALERT  Critical Value:  K+ > 7.5  Date & Time Notied: 05-10-20 @ 0405  Provider Notified: Allyson Sabal MD  Orders Received/Actions taken: see new orders for insulin, dextrose, and calcium gluconate.

## 2020-05-10 NOTE — Progress Notes (Signed)
CRITICAL VALUE ALERT  Critical Value: INR 4.5  Date & Time Notied:  05-10-20 @ 0525  Provider Notified: 05-10-20 @ 0529  Orders Received/Actions taken: no new orders

## 2020-05-10 NOTE — Progress Notes (Signed)
[facility] for warfarin Indication: Mechanical AVR  Allergies  Allergen Reactions  . Zosyn [Piperacillin Sod-Tazobactam So]     Itching     Patient Measurements: Height: 6' (182.9 cm) Weight: 65.8 kg (145 lb) IBW/kg (Calculated) : 77.6 Heparin Dosing Weight: 65.8 kg   Vital Signs: Temp: 98 F (36.7 C) (12/08 0740) Temp Source: Oral (12/08 0740) BP: 129/57 (12/08 0740) Pulse Rate: 76 (12/08 0740)  Labs: Recent Labs    05/09/20 0952 05/09/20 1316 05/10/20 0224 05/10/20 0600  HGB 7.1*  --  7.9*  --   HCT 22.2*  --  23.8*  --   PLT 106*  --  93*  --   LABPROT  --  40.4* 41.2*  --   INR  --  4.4* 4.5*  --   CREATININE 3.73*  --  4.11* 4.01*    Estimated Creatinine Clearance: 22.1 mL/min (A) (by C-G formula based on SCr of 4.01 mg/dL (H)).   Assessment: 2 YOM with SOB secondary to COVID-19 on warfarin PTA for h/o of mechanical AVR. Pharmacy consulted to resume warfarin therapy.   INR supratherapeutic at 4.5  Goal of Therapy:  INR 2-3 Monitor platelets by anticoagulation protocol: Yes   Plan:  Continue to hold warfarin Daily INR  Thank you Anette Guarneri, PharmD  Please refer to [facility] for unit-specific pharmacist

## 2020-05-10 NOTE — Progress Notes (Signed)
Patient transferred from [phone] to [phone]. All belongings at bedside. Will continue to monitor.

## 2020-05-10 NOTE — Progress Notes (Addendum)
[facility] Progress Note   44 y.o. 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 He was on dialysis since age 89 and also between the transplants with the the 2nd one in 2006 and the current one 5 years ago.  He is  on prednisone 5mg , prograf 3mg  BID, and was also started on Bumex when he was retaining fluid. 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 Here with Covid.  Assessment/ Plan:   1. AKI? Vs CKDIV with a DDRT transplant #3 from [facility]) followed by Dr. Dolores Frame. He reports that he's had fairly high cr but does not remember baseline cr. He believes it was in the 3's and actually had Bumex started bec of fluid retention. Doesn't appear that he's on Imuran or Cellcept and I wonder if he's had CMV or polyoma.  - I've called [facility] and had the oncall nephrologist paged. His BL creatinine was already in the mid 3's. - But for now I would fluid resuscitate with NS -> will give him 1 more liter. Strict I&O's with only floor weights.. - Continue steroids and Prograf; will check a tac level. - Urine studies. - Will give another dose of Lasix, Lokelma and NS (help with distal Na/K exchange).  - Transplant renal ultrasound -> no obstruction. - I also spoke with him about dialysis but he wishes to wait if possible. Will recheck a Bmet later today after all the medical treatment. Renal function may be starting to stabilize but RRT still very possible.  2. 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 3. Anemia - Transfuse as needed; no IV iron with active infection. 4. COVID PNA - per primary team. 5. H/o AVR  Subjective:   States that he does not have any worsening of dyspnea and is only on 1.5L . SPoke at length about dialysis and he wishes to hold off if at all possible. Denies  fevers, + chills, poor appetite. No nausea.   Objective:   BP (!) 129/57 (BP Location: Right Arm)   Pulse 76   Temp 98 F (36.7 C) (Oral)   Resp 18   Ht 6' (1.829 m)   Wt 65.8 kg   SpO2 100%   BMI 19.67 kg/m   Intake/Output Summary (Last 24 hours) at 05/10/2020 0840 Last data filed at 05/10/2020 9924 Gross per 24 hour  Intake 3505.58 ml  Output --  Net 3505.58 ml   Weight change:   Physical Exam: GEN: NAD, A&Ox3, NCAT HEENT: No conjunctival pallor, EOMI NECK: Supple, no thyromegaly LUNGS: rhonchi, no wheezing CV: RRR, No rubs or gallops ABD: SNDNT +BS  EXT:  Tr lower extremity edema ACCESS: LUA AVG + thrill   Imaging: DG Chest 1 View  Result Date: 05/09/2020 CLINICAL DATA:  Shortness of breath, weakness, cough. EXAM: CHEST  1 VIEW COMPARISON:  None. FINDINGS: Bilateral patchy pulmonary opacities. No pneumothorax or pleural effusion. Postsurgical appearance of an enlarged cardiac silhouette. No acute osseous abnormality. Radiodensity overlying the right upper lung may reflect a vascular graft. IMPRESSION: Bilateral pulmonary opacities concerning for infection. Cardiomegaly. Electronically Signed   By: Primitivo Gauze M.D.   On: 05/09/2020 11:07   US RENAL  Result Date: 05/09/2020 CLINICAL DATA:  Acute renal failure.  COVID. EXAM: RENAL / URINARY TRACT ULTRASOUND COMPLETE COMPARISON:  None.  FINDINGS: Right Kidney: Renal measurements: 8.6 x 3.5 x 3.4 cm = volume: 53 mL. Atrophic and echogenic. Left Kidney: Renal measurements: 11.7 x 4.1 x 3.1 cm = volume: 81 mL. Atrophic and echogenic. Anechoic cystic lesion extending from lower pole of the LEFT kidney. Transplant kidney RIGHT lower quadrant: 11.5 x 4.1 x 5.4 cm.  No hydronephrosis. Bladder: Appears normal for degree of bladder distention. Other: None. IMPRESSION: 1. Transplant kidney in the RIGHT lower quadrant appears normal. 2. Atrophic native kidneys. Electronically Signed   By: Suzy Bouchard M.D.   On: 05/09/2020 19:08     Labs: BMET Recent Labs  Lab 05/09/20 0952 05/10/20 0224 05/10/20 0600  NA 140 137 137  K 5.0 >7.5* 6.1*  CL 112* 111 110  CO2 15* 15* 17*  GLUCOSE 103* 91 107*  BUN 90* 90* 95*  CREATININE 3.73* 4.11* 4.01*  CALCIUM 7.3* 6.5* 6.4*   CBC Recent Labs  Lab 05/09/20 0952 05/10/20 0224  WBC 5.2 4.8  NEUTROABS 3.7 3.3  HGB 7.1* 7.9*  HCT 22.2* 23.8*  MCV 96.1 91.9  PLT 106* 93*    Medications:    . albuterol  10 mg Nebulization Once  . aspirin EC  81 mg Oral Daily  . atorvastatin  40 mg Oral Daily  . calcitRIOL  0.5 mcg Oral Daily  . carvedilol  12.5 mg Oral BID WC  . dexamethasone  6 mg Oral Q24H  . levETIRAcetam  500 mg Oral BID  . pantoprazole  20 mg Oral Daily  . tacrolimus  3 mg Oral BID  . Warfarin - Pharmacist Dosing Inpatient   Does not apply q1600      Otelia Santee, MD 05/10/2020, 8:40 AM

## 2020-05-10 NOTE — Progress Notes (Signed)
CRITICAL VALUE ALERT  Critical Value:  Ca 6.4  Date & Time Notied:  05-10-20 @ 8921  Provider Notified: Allyson Sabal MD  Orders Received/Actions taken: awaiting orders.

## 2020-05-10 NOTE — Progress Notes (Signed)
Subjective:   Hospital day:1   Overnight event: Patient had a critical K+ 7.5. A STAT EKG was ordered and he was given calcium gluconate 1g IV, albuterol nebs, insulin with dextrose, and lokelma 5g. Dr. Royce Macadamia was consulted and she placed 1g IV calcium gluconate, lasix 80mg , and bicarb x2. Nephrology will decide if patient will need dialysis.  This AM, patient states he is feeling much better. His headache is improving but he continues to have chills. He had O2 sats of 100% on [facility] so patient was taken off O2 and maintained sats around 93-95%.   Objective:  Vital signs in last 24 hours: Vitals:   05/09/20 2130 05/09/20 2200 05/09/20 2310 05/10/20 0416  BP: (!) 166/66 (!) 156/62  (!) 131/45  Pulse: 80 78  82  Resp: (!) 26 (!) 25 20 20   Temp:   98.8 F (37.1 C) 98.3 F (36.8 C)  TempSrc:   Oral Oral  SpO2: 100% 100%  98%  Weight:      Height:        Physical Exam  General:Pleasant, mildly cachetic middle-age laying in bed. No acute distress. Appears older than stated age.  Head:Normocephalic. Atraumatic. HEENT: Sensorineural hearing loss. Has hearing aid.  CV: RRR.No rubs, or gallops. Systolic flow murmur with click. No LE edema Pulmonary:Off O2. Lungs CTAB. Normal effort. No wheezing or rales. Abdominal:Soft,nontender,nondistended. Normal bowel sounds. Extremities: Palpable pulses. Normal ROM. Skin:Warm and dry. No obvious rash or lesions. Neuro:A&Ox3.Moves all extremities. Normal sensation. No focal deficit. Psych: Normal mood and affect  Assessment/Plan: Wesley Bell is a 44 y.o. 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  Principal Problem:   Pneumonia due to COVID-19 virus  #Acute  respiratory failure 2/2 COVID pneumonia Patient w/ dyspnea, body aches, cough, fever/chills and found to be in acute hypoxic respiratory failure, currently requiring 4L oxygen. Inflammatory markers elevated at CRP 5.2, D-dimer 0.67, ferritin 449, LDH 258 . Pro-calcitonin of 1.68. Normal lactic acid, fibrinogen, trig. Unvaccinated status. COVID + on 12/07. CXR shows bilateral pulmonary opacities. Inflammatory markers rising. Patient had O2 sats of 100% on [facility] so patient was taken off O2 and maintained sats around 93-95%. Continue to monitor.  --Continue remdesivir (day 2/5) --Continue dexamethasone (day 2/10) --Robitussin-DM 10 mL q4h prn for cough --Tussionex 5 mL q12h prn for cough --Trend CMP, inflammatory markers --F/u bcx --Daily CBC --O2 as need to keep >88 --Early ambulation, proning as tolerated, incentive spirometry --SSI while on steroids  #ESRD s/p kidney transplant x3 Secondary to Alport syndrome. Patient received kidney transplant in 2001 & 2006 and both failed. His third transplant in 2016 has successful so far. Baseline cr around 3. Found to have BUN of 90 and creat of 3.73. Renal U/S shows right transplant kidney appears normal and atrophic native kidneys. K+ increased to >7.5 overnight. Improved to 6.1 s/p treatment w/ calcium gluconate 1g IV, albuterol nebs, insulin w/ D50, and lokelma 5g. Calcium level dropped to 6.4. Normal mag. EKG shows improving peaked T waves but no QT prolongation. Nephro will hold off dialysis for now. Plan to recheck BMP in the afternoon --Nephro consulted, appreciate recs     ---Giving another dose of Lasix, Lokelma and NS (help with distal Na/K  exchange).       ---Pending 4 pm BMP --Prednisone 5mg  daily --Calcitriol 0.5 mcg daily --Holding Bumex --Prograf 3 mg BID --F/u tac levels --F/u Urine studies. --Daily BMP  #mechanical AVR on Coumadin Patient states he had his aortic valve replaced in 2012. He is currently on warfarin and  takes 7.5 mg on Monday, Wednesday and Saturday then 3 mg the rest of the week. PT/INR of 41.2/4.5 today. --Pharm consult for coumadin dosage --F/u PT/INR --Aspirin 81 mg daily --Lipitor 40 mg daily  #HTN Patient was normotensive on arrival. BP continues to rise since admission. BP improved to 131/45 this AM.  --Coreg 12.5 mg twice daily  #Seizure Patient with a history of seizure on Keppra. Stable --Keppra 500 mg twice daily  #Pain Patient endorses mild chest discomfort and generalized pain. --Tylenol 650 mg q6h prn for mild pain --Oxycodone 5 mg q6h prn for mod pain  CODE STATUS: Full Code DIET: Renal diet PPx: Warfarin  Prior to Admission Living Arrangement: Home Anticipated Discharge Location: Home Barriers to Discharge: Medical work up Dispo: Anticipated discharge in approximately 1-2 day(s).   Signed: Lacinda Axon, MD 05/10/2020, 7:04 AM  Pager: [phone] Internal Medicine Teaching Service After 5pm on weekdays and 1pm on weekends: On Call pager: [phone]

## 2020-05-11 DIAGNOSIS — U071 COVID-19: Principal | ICD-10-CM

## 2020-05-11 DIAGNOSIS — J1282 Pneumonia due to coronavirus disease 2019: Secondary | ICD-10-CM

## 2020-05-11 LAB — BASIC METABOLIC PANEL
Anion gap: 11 (ref 5–15)
BUN: 100 mg/dL — ABNORMAL HIGH (ref 6–20)
CO2: 19 mmol/L — ABNORMAL LOW (ref 22–32)
Calcium: 6.9 mg/dL — ABNORMAL LOW (ref 8.9–10.3)
Chloride: 105 mmol/L (ref 98–111)
Creatinine, Ser: 4.97 mg/dL — ABNORMAL HIGH (ref 0.61–1.24)
GFR, Estimated: 14 mL/min — ABNORMAL LOW (ref >60)
Glucose, Bld: 311 mg/dL — ABNORMAL HIGH (ref 70–99)
Potassium: 5.7 mmol/L — ABNORMAL HIGH (ref 3.5–5.1)
Sodium: 135 mmol/L (ref 135–145)

## 2020-05-11 LAB — CBC WITH DIFFERENTIAL/PLATELET
Abs Immature Granulocytes: 0.01 10*3/uL (ref 0.00–0.07)
Basophils Absolute: 0 10*3/uL (ref 0.0–0.1)
Basophils Relative: 1 %
Eosinophils Absolute: 0 10*3/uL (ref 0.0–0.5)
Eosinophils Relative: 0 %
HCT: 25.8 % — ABNORMAL LOW (ref 39.0–52.0)
Hemoglobin: 8.2 g/dL — ABNORMAL LOW (ref 13.0–17.0)
Immature Granulocytes: 0 %
Lymphocytes Relative: 9 %
Lymphs Abs: 0.3 10*3/uL — ABNORMAL LOW (ref 0.7–4.0)
MCH: 29.6 pg (ref 26.0–34.0)
MCHC: 31.8 g/dL (ref 30.0–36.0)
MCV: 93.1 fL (ref 80.0–100.0)
Monocytes Absolute: 0.2 10*3/uL (ref 0.1–1.0)
Monocytes Relative: 7 %
Neutro Abs: 3.1 10*3/uL (ref 1.7–7.7)
Neutrophils Relative %: 83 %
Platelets: 96 10*3/uL — ABNORMAL LOW (ref 150–400)
RBC: 2.77 MIL/uL — ABNORMAL LOW (ref 4.22–5.81)
RDW: 16.7 % — ABNORMAL HIGH (ref 11.5–15.5)
WBC: 3.7 10*3/uL — ABNORMAL LOW (ref 4.0–10.5)
nRBC: 0 % (ref 0.0–0.2)

## 2020-05-11 LAB — COMPREHENSIVE METABOLIC PANEL
ALT: 16 U/L (ref 0–44)
AST: 20 U/L (ref 15–41)
Albumin: 2.8 g/dL — ABNORMAL LOW (ref 3.5–5.0)
Alkaline Phosphatase: 33 U/L — ABNORMAL LOW (ref 38–126)
Anion gap: 10 (ref 5–15)
BUN: 99 mg/dL — ABNORMAL HIGH (ref 6–20)
CO2: 18 mmol/L — ABNORMAL LOW (ref 22–32)
Calcium: 6.8 mg/dL — ABNORMAL LOW (ref 8.9–10.3)
Chloride: 106 mmol/L (ref 98–111)
Creatinine, Ser: 4.56 mg/dL — ABNORMAL HIGH (ref 0.61–1.24)
GFR, Estimated: 16 mL/min — ABNORMAL LOW (ref >60)
Glucose, Bld: 187 mg/dL — ABNORMAL HIGH (ref 70–99)
Potassium: 6.9 mmol/L (ref 3.5–5.1)
Sodium: 134 mmol/L — ABNORMAL LOW (ref 135–145)
Total Bilirubin: 1.1 mg/dL (ref 0.3–1.2)
Total Protein: 5.7 g/dL — ABNORMAL LOW (ref 6.5–8.1)

## 2020-05-11 LAB — PROTIME-INR
INR: 5.2 (ref 0.8–1.2)
Prothrombin Time: 46.2 seconds — ABNORMAL HIGH (ref 11.4–15.2)

## 2020-05-11 LAB — D-DIMER, QUANTITATIVE: D-Dimer, Quant: 0.47 ug/mL-FEU (ref 0.00–0.50)

## 2020-05-11 LAB — C-REACTIVE PROTEIN: CRP: 19 mg/dL — ABNORMAL HIGH (ref <1.0)

## 2020-05-11 LAB — FERRITIN: Ferritin: 506 ng/mL — ABNORMAL HIGH (ref 24–336)

## 2020-05-11 MED ORDER — DEXTROSE 50 % IV SOLN
25.0000 g | INTRAVENOUS | Status: AC
  Administered 2020-05-11: 25 g via INTRAVENOUS
  Filled 2020-05-11: qty 50

## 2020-05-11 MED ORDER — INSULIN ASPART 100 UNIT/ML IV SOLN
5.0000 [IU] | INTRAVENOUS | Status: AC
  Administered 2020-05-11: 5 [IU] via INTRAVENOUS

## 2020-05-11 MED ORDER — FUROSEMIDE 10 MG/ML IJ SOLN
80.0000 mg | Freq: Once | INTRAMUSCULAR | Status: AC
  Administered 2020-05-11: 80 mg via INTRAVENOUS
  Filled 2020-05-11: qty 8

## 2020-05-11 MED ORDER — CALCIUM GLUCONATE-NACL 1-0.675 GM/50ML-% IV SOLN
1.0000 g | INTRAVENOUS | Status: AC
  Administered 2020-05-11: 1000 mg via INTRAVENOUS
  Filled 2020-05-11: qty 50

## 2020-05-11 MED ORDER — SODIUM ZIRCONIUM CYCLOSILICATE 10 G PO PACK
10.0000 g | PACK | ORAL | Status: AC
  Administered 2020-05-11: 10 g via ORAL
  Filled 2020-05-11: qty 1

## 2020-05-11 MED ORDER — SODIUM BICARBONATE 8.4 % IV SOLN
50.0000 meq | INTRAVENOUS | Status: AC
  Administered 2020-05-11: 50 meq via INTRAVENOUS
  Filled 2020-05-11: qty 50

## 2020-05-11 MED ORDER — SODIUM BICARBONATE 650 MG PO TABS
1300.0000 mg | ORAL_TABLET | Freq: Two times a day (BID) | ORAL | Status: DC
  Administered 2020-05-11 – 2020-05-12 (×3): 1300 mg via ORAL
  Filled 2020-05-11 (×3): qty 2

## 2020-05-11 MED ORDER — SODIUM ZIRCONIUM CYCLOSILICATE 10 G PO PACK
10.0000 g | PACK | Freq: Two times a day (BID) | ORAL | Status: AC
  Administered 2020-05-11 (×2): 10 g via ORAL
  Filled 2020-05-11 (×2): qty 1

## 2020-05-11 NOTE — Progress Notes (Signed)
[place] for warfarin Indication: Mechanical AVR  Allergies  Allergen Reactions  . Epimedium Itching  . Zosyn [Piperacillin Sod-Tazobactam So] Itching        Labs: Recent Labs    05/09/20 0952 05/09/20 1316 05/10/20 0224 05/10/20 0600 05/10/20 1456 05/11/20 0054 05/11/20 0549  HGB 7.1*  --  7.9*  --   --  8.2*  --   HCT 22.2*  --  23.8*  --   --  25.8*  --   PLT 106*  --  93*  --   --  96*  --   LABPROT  --  40.4* 41.2*  --   --  46.2*  --   INR  --  4.4* 4.5*  --   --  5.2*  --   CREATININE 3.73*  --  4.11*   < > 4.27* 4.56* 4.97*   < > = values in this interval not displayed.    Estimated Creatinine Clearance: 17.8 mL/min (A) (by C-G formula based on SCr of 4.97 mg/dL (H)).   Assessment: 89 YOM with SOB secondary to COVID-19 on warfarin PTA for h/o of mechanical AVR. Pharmacy consulted to resume warfarin therapy.   INR supratherapeutic  Goal of Therapy:  INR 2-3 Monitor platelets by anticoagulation protocol: Yes   Plan:  Continue to hold warfarin Daily INR  Thank you Anette Guarneri, PharmD  Please refer to [facility] for unit-specific pharmacist

## 2020-05-11 NOTE — Progress Notes (Signed)
Subjective:   Hospital day:1   Overnight event: Patient had a critical K+ 6.9. A STAT EKG was ordered and he was given calcium gluconate 1g IV, albuterol nebs, insulin with dextrose, and lokelma 10g. Dr. Joylene Grapes was consulted, he agreed with treatment, and recommended informing Dr. Augustin Coupe during the day.  This AM, patient states he is doing much better. His pain has resolved. States his O2 levels dropped when he got up so they put him back on O2. States his potassium levels have always been high in the past. He wants to hold off doing dialysis.   Objective:  Vital signs in last 24 hours: Vitals:   05/10/20 1955 05/10/20 2058 05/10/20 2113 05/11/20 0513  BP:   (!) 155/65 (!) 149/70  Pulse:   77 63  Resp: 16 18 17 18   Temp:   98.4 F (36.9 C) (!) 97.3 F (36.3 C)  TempSrc:   Oral Oral  SpO2:   97% 100%  Weight:      Height:        Physical Exam  General:Pleasant, mildly cachetic middle-age laying in bed. No acute distress. Appears older than stated age.  Head:Normocephalic. Atraumatic. HEENT: Sensorineural hearing loss. Has hearing aid.  CV: RRR.No rubs, or gallops. Systolic flow murmur with click. No LE edema Pulmonary:Off O2. Lungs CTAB. Normal effort. No wheezing or rales. Abdominal:Soft,nontender,nondistended. Normal bowel sounds. Extremities: Palpable pulses. Normal ROM. Skin:Warm and dry. No obvious rash or lesions. Neuro:A&Ox3.Moves all extremities. Normal sensation. No focal deficit. Psych: Normal mood and affect  Assessment/Plan: Wesley Bell is a 44 y.o. 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  Principal Problem:   Pneumonia due to COVID-19 virus  #Acute respiratory failure 2/2 COVID  pneumonia Patient w/ dyspnea, body aches, cough, fever/chills and found to be in acute hypoxic respiratory failure, currently requiring 4L oxygen. Inflammatory markers elevated at CRP 5.2, D-dimer 0.67, ferritin 449, LDH 258 . Pro-calcitonin of 1.68. Normal lactic acid, fibrinogen, trig. Vaccinated with J&J vaccine. COVID+ on 12/07. CXR shows bilateral pulmonary opacities. D-dimer now normal, CRP trending up, ferritin trending down. O2 sats dropped to 82% yesterday evening, put back on 2L O2 with sats 98-100%. Continue to monitor. Now on RA with O2 sats --Continue remdesivir (day 3/5) --Continue dexamethasone (day 3/10) --Robitussin-DM 10 mL q4h prn for cough --Tussionex 5 mL q12h prn for cough --Trend CMP, inflammatory markers --F/u bcx --Daily CBC --O2 as need to keep >88 --Early ambulation, proning as tolerated, incentive spirometry --SSI while on steroids  #ESRD s/p kidney transplant x3 Secondary to Alport syndrome. Patient received kidney transplant in 2001 & 2006 and both failed. His third transplant in 2016 has successful so far. Baseline cr around 3. Found to have BUN of 90 and creat of 3.73. Renal U/S shows right transplant kidney appears normal and atrophic native kidneys. K+ of 6.9 overnight. Giiven calcium gluconate 1g IV, albuterol nebs, insulin w/ D50, and lokelma 10g. EKG shows peaked T waves and QT prolongation. Repeat K+ improved to 5.7. Calcium up to 6.9. Worsening renal function w/ Cr of 4.97, BUN 100. Patient wants to hold off dialysis.  --Nephro consulted, appreciate recs     --Giving another dose of lasix and IVNS     --Start Sodium bicarb 1300 mg  BID     --Start Lokelma 10 mg BID     --Urine studies >3% --Prednisone 5mg  daily --Calcitriol 0.5 mcg daily --Holding Bumex --Prograf 3 mg BID --Pending tac levels --Daily BMP  #mechanical AVR on Coumadin Patient states he had his aortic valve replaced in 2012. He is currently on warfarin and takes 7.5 mg on Monday,  Wednesday and Saturday then 3 mg the rest of the week. PT/INR of 46.2/5.2 today.  --Pharm consulted for coumadin dosage --F/u PT/INR --Aspirin 81 mg daily --Lipitor 40 mg daily  #HTN Patient was normotensive on arrival. BP continues to rise since admission. BP slightly elevated to 149/70 this AM. Continue to monitor. --Coreg 12.5 mg twice daily  #Seizure Patient with a history of seizure on Keppra. Stable --Keppra 500 mg twice daily  #Pain Patient endorses mild chest discomfort and generalized pain. Patient is currently pain-free. --Tylenol 650 mg q6h prn for mild pain --Oxycodone 5 mg q6h prn for mod pain  CODE STATUS: Full Code DIET: Renal diet PPx: Warfarin dosed by pharmacy  Prior to Admission Living Arrangement: Home Anticipated Discharge Location: Home Barriers to Discharge: Medical work up Dispo: Anticipated discharge in approximately 1-2 day(s).   Signed: Lacinda Axon, MD 05/11/2020, 6:25 AM  Pager: [phone] Internal Medicine Teaching Service After 5pm on weekdays and 1pm on weekends: On Call pager: [phone]

## 2020-05-11 NOTE — Progress Notes (Addendum)
[name] Progress Note   44 y.o.malewith a history of renal failure currently on his 3rd transplant from [facility]) with the 1st and 3rd being DDRT and the 2nd a living transplant from his ex-wife. He was on dialysis since age 4 and also between the transplants with the the 2nd one in 2006 and the current one 5 years ago.  He is  on prednisone 5mg , prograf 3mg  BID, and was also started on Bumex when he was retaining fluid. 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 Here with Covid.  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 Dolores Frame. I've called [facility] and had the oncall nephrologist paged when he was 1st admitted. His BL creatinine was already in the mid 3's. He was started on Bumex bec of fluid  retention. Doesn't appear that he's on Imuran or Cellcept and I wonder if he's had CMV or polyoma.  - Strict I&O's with only floor weights.. - Continue steroids and Prograf; requested  a tac level. - Urine studies >3%. - Will give another dose of Lasix and NS (help with distal Na/K exchange); needs to continue Lokelma BID. Start HCO3 1300 BID - Transplant renal ultrasound -> no obstruction.  - I also spoke with him again about dialysis but he wishes to wait if possible. Will hold off until absolutely necessary; thankfully he did have 753ml of UOP /24hrs.  - RRT still very possible in the near future if he doesn't plateau and improve. I counseled the patient about this as well.  2. Renal osteodystrophy - will check a phos but he's not really eating much. 3. Anemia - Transfuse as needed; no IV iron with active infection. 4. COVID PNA - per primary team. 5. H/o AVR   Subjective:   States that he does not have any worsening of dyspnea and is only on 2L [place] with 99% sats. Spoke at length about dialysis again and he wishes to hold off if at all  possible. Denies fevers, + chills, poor appetite (mainly bec of the food). No nausea.   Objective:   BP (!) 149/70 (BP Location: Right Wrist)   Pulse 63   Temp (!) 97.3 F (36.3 C) (Oral)   Resp 18   Ht 6' (1.829 m)   Wt 65.8 kg   SpO2 100%   BMI 19.67 kg/m   Intake/Output Summary (Last 24 hours) at 05/11/2020 0853 Last data filed at 05/11/2020 0602 Gross per 24 hour  Intake 581.74 ml  Output 725 ml  Net -143.26 ml   Weight change:   Physical Exam: GEN: NAD, A&Ox3, NCAT HEENT: No conjunctival pallor, EOMI NECK: Supple, no thyromegaly LUNGS: rhonchi, no wheezing CV: RRR, No rubs or gallops ABD: SNDNT +BS  EXT:  Tr lower extremity edema ACCESS: LUA AVG + thrill  Imaging: DG Chest 1 View  Result Date: 05/09/2020 CLINICAL DATA:  Shortness of breath, weakness, cough. EXAM: CHEST  1 VIEW COMPARISON:  None. FINDINGS: Bilateral patchy pulmonary opacities. No pneumothorax or pleural effusion. Postsurgical appearance of an enlarged cardiac silhouette. No acute osseous abnormality. Radiodensity overlying the right upper lung may reflect a vascular graft. IMPRESSION: Bilateral pulmonary opacities concerning for infection. Cardiomegaly. Electronically Signed   By: Primitivo Gauze M.D.   On: 05/09/2020 11:07   US RENAL  Result Date: 05/09/2020 CLINICAL DATA:  Acute renal failure.  COVID. EXAM: RENAL /  URINARY TRACT ULTRASOUND COMPLETE COMPARISON:  None. FINDINGS: Right Kidney: Renal measurements: 8.6 x 3.5 x 3.4 cm = volume: 53 mL. Atrophic and echogenic. Left Kidney: Renal measurements: 11.7 x 4.1 x 3.1 cm = volume: 81 mL. Atrophic and echogenic. Anechoic cystic lesion extending from lower pole of the LEFT kidney. Transplant kidney RIGHT lower quadrant: 11.5 x 4.1 x 5.4 cm.  No hydronephrosis. Bladder: Appears normal for degree of bladder distention. Other: None. IMPRESSION: 1. Transplant kidney in the RIGHT lower quadrant appears normal. 2. Atrophic native kidneys. Electronically  Signed   By: Suzy Bouchard M.D.   On: 05/09/2020 19:08    Labs: BMET Recent Labs  Lab 05/09/20 0952 05/10/20 0224 05/10/20 0600 05/10/20 1456 05/11/20 0054 05/11/20 0549  NA 140 137 137 138 134* 135  K 5.0 >7.5* 6.1* 6.4* 6.9* 5.7*  CL 112* 111 110 107 106 105  CO2 15* 15* 17* 19* 18* 19*  GLUCOSE 103* 91 107* 117* 187* 311*  BUN 90* 90* 95* 97* 99* 100*  CREATININE 3.73* 4.11* 4.01* 4.27* 4.56* 4.97*  CALCIUM 7.3* 6.5* 6.4* 6.8* 6.8* 6.9*  PHOS  --   --  5.8*  --   --   --    CBC Recent Labs  Lab 05/09/20 0952 05/10/20 0224 05/11/20 0054  WBC 5.2 4.8 3.7*  NEUTROABS 3.7 3.3 3.1  HGB 7.1* 7.9* 8.2*  HCT 22.2* 23.8* 25.8*  MCV 96.1 91.9 93.1  PLT 106* 93* 96*    Medications:    . albuterol  10 mg Nebulization Once  . aspirin EC  81 mg Oral Daily  . atorvastatin  40 mg Oral Daily  . calcitRIOL  0.5 mcg Oral Daily  . carvedilol  12.5 mg Oral BID WC  . dexamethasone  6 mg Oral Q24H  . levETIRAcetam  500 mg Oral BID  . ondansetron (ZOFRAN) IV  4 mg Intravenous Q6H  . pantoprazole  20 mg Oral Daily  . tacrolimus  3 mg Oral BID  . Warfarin - Pharmacist Dosing Inpatient   Does not apply q1600      Otelia Santee, MD 05/11/2020, 8:53 AM

## 2020-05-11 NOTE — Progress Notes (Addendum)
Inpatient Diabetes Program Recommendations  AACE/ADA: New Consensus Statement on Inpatient Glycemic Control (2015)  Target Ranges:  Prepandial:   less than 140 mg/dL      Peak postprandial:   less than 180 mg/dL (1-2 hours)      Critically ill patients:  140 - 180 mg/dL   No results found for: GLUCAP, HGBA1C  Review of Glycemic Control Results for KIRE, FERG (MRN [id number]) as of 05/11/2020 11:56  Ref. Range 05/10/2020 14:56 05/11/2020 00:54 05/11/2020 05:49  Glucose Latest Ref Range: 70 - 99 mg/dL 117 (H) 187 (H) 311 (H)   Diabetes history: no DM Outpatient Diabetes medications: none Current orders for Inpatient glycemic control: none Decadron 6 mg QD  Inpatient Diabetes Program Recommendations:    Noted Am serum was 311 mg/dL. May want to consider in the setting of steroids/coivd to check CBGs TID. If CBGs remain above inpatient goals of 180 mg/dL, would recommend adding Novolog 0-6 units TID & HS.  Thanks, Bronson Curb, MSN, RNC-OB Diabetes Coordinator [phone] (8a-5p)

## 2020-05-11 NOTE — Progress Notes (Signed)
Paged by RN Aneta Mins for K 6.9. No hemolysis noted on labs. Ordered STAT EKG, 1g IV calcium gluconate, insulin with dextrose, lokelma 10g once, and 1 amp bicarbonate. Repeat BMP in 1 hour. Spoke with Dr. Joylene Grapes, nephrology, who agrees with this and advised to call Dr. Augustin Coupe during the day time for further management.

## 2020-05-11 NOTE — Progress Notes (Signed)
CRITICAL VALUE ALERT  Critical Value: K 6.9  Date & Time Notied: 05/11/20 @ 7035  Provider Notified: Allyson Sabal MD  Orders Received/Actions taken: awaiting orders

## 2020-05-11 NOTE — Progress Notes (Signed)
CRITICAL VALUE ALERT  Critical Value:  INR 5.2, PT 46.2  Date & Time Notied:  05/11/20 @ 9191  Provider Notified: Allyson Sabal MD  Orders Received/Actions taken: no new orders

## 2020-05-12 ENCOUNTER — Other Ambulatory Visit (HOSPITAL_COMMUNITY): Payer: Self-pay | Admitting: Student

## 2020-05-12 LAB — CBC WITH DIFFERENTIAL/PLATELET
Abs Immature Granulocytes: 0.01 10*3/uL (ref 0.00–0.07)
Basophils Absolute: 0 10*3/uL (ref 0.0–0.1)
Basophils Relative: 0 %
Eosinophils Absolute: 0 10*3/uL (ref 0.0–0.5)
Eosinophils Relative: 0 %
HCT: 23.1 % — ABNORMAL LOW (ref 39.0–52.0)
Hemoglobin: 7.5 g/dL — ABNORMAL LOW (ref 13.0–17.0)
Immature Granulocytes: 0 %
Lymphocytes Relative: 9 %
Lymphs Abs: 0.3 10*3/uL — ABNORMAL LOW (ref 0.7–4.0)
MCH: 29.4 pg (ref 26.0–34.0)
MCHC: 32.5 g/dL (ref 30.0–36.0)
MCV: 90.6 fL (ref 80.0–100.0)
Monocytes Absolute: 0.2 10*3/uL (ref 0.1–1.0)
Monocytes Relative: 6 %
Neutro Abs: 2.7 10*3/uL (ref 1.7–7.7)
Neutrophils Relative %: 85 %
Platelets: 109 10*3/uL — ABNORMAL LOW (ref 150–400)
RBC: 2.55 MIL/uL — ABNORMAL LOW (ref 4.22–5.81)
RDW: 15.8 % — ABNORMAL HIGH (ref 11.5–15.5)
WBC: 3.2 10*3/uL — ABNORMAL LOW (ref 4.0–10.5)
nRBC: 0 % (ref 0.0–0.2)

## 2020-05-12 LAB — C-REACTIVE PROTEIN: CRP: 14.6 mg/dL — ABNORMAL HIGH (ref <1.0)

## 2020-05-12 LAB — D-DIMER, QUANTITATIVE: D-Dimer, Quant: 0.38 ug/mL-FEU (ref 0.00–0.50)

## 2020-05-12 LAB — COMPREHENSIVE METABOLIC PANEL
ALT: 12 U/L (ref 0–44)
AST: 16 U/L (ref 15–41)
Albumin: 2.5 g/dL — ABNORMAL LOW (ref 3.5–5.0)
Alkaline Phosphatase: 31 U/L — ABNORMAL LOW (ref 38–126)
Anion gap: 13 (ref 5–15)
BUN: 121 mg/dL — ABNORMAL HIGH (ref 6–20)
CO2: 19 mmol/L — ABNORMAL LOW (ref 22–32)
Calcium: 6.6 mg/dL — ABNORMAL LOW (ref 8.9–10.3)
Chloride: 102 mmol/L (ref 98–111)
Creatinine, Ser: 5.18 mg/dL — ABNORMAL HIGH (ref 0.61–1.24)
GFR, Estimated: 13 mL/min — ABNORMAL LOW (ref >60)
Glucose, Bld: 341 mg/dL — ABNORMAL HIGH (ref 70–99)
Potassium: 5.2 mmol/L — ABNORMAL HIGH (ref 3.5–5.1)
Sodium: 134 mmol/L — ABNORMAL LOW (ref 135–145)
Total Bilirubin: 1 mg/dL (ref 0.3–1.2)
Total Protein: 5.2 g/dL — ABNORMAL LOW (ref 6.5–8.1)

## 2020-05-12 LAB — GLUCOSE, CAPILLARY: Glucose-Capillary: 260 mg/dL — ABNORMAL HIGH (ref 70–99)

## 2020-05-12 LAB — PROTIME-INR
INR: 4 — ABNORMAL HIGH (ref 0.8–1.2)
Prothrombin Time: 37.7 seconds — ABNORMAL HIGH (ref 11.4–15.2)

## 2020-05-12 LAB — FERRITIN: Ferritin: 864 ng/mL — ABNORMAL HIGH (ref 24–336)

## 2020-05-12 MED ORDER — LOKELMA 10 G PO PACK: 10.0000 g | PACK | Freq: Two times a day (BID) | ORAL | 0 refills | Status: DC

## 2020-05-12 MED ORDER — INSULIN ASPART 100 UNIT/ML ~~LOC~~ SOLN: 7.0000 [IU] | Freq: Three times a day (TID) | SUBCUTANEOUS | Status: DC

## 2020-05-12 MED ORDER — INSULIN ASPART 100 UNIT/ML ~~LOC~~ SOLN: 0.0000 [IU] | SUBCUTANEOUS | Status: DC

## 2020-05-12 MED ORDER — PREDNISONE 5 MG PO TABS: 5.0000 mg | ORAL_TABLET | Freq: Every day | ORAL | Status: DC

## 2020-05-12 MED ORDER — SODIUM BICARBONATE 650 MG PO TABS: 1300.0000 mg | ORAL_TABLET | Freq: Three times a day (TID) | ORAL | Status: DC

## 2020-05-12 MED FILL — LOKELMA 10 GM PACK: 10 | 3 days supply | Qty: 6 | Fill #0

## 2020-05-12 NOTE — Plan of Care (Signed)
Patient is currently resting in bed. OOB to BSC. VSS. Remains on room air. Call bell within reach. No complaints overnight.  Problem: Education: Goal: Knowledge of risk factors and measures for prevention of condition will improve Outcome: Progressing   Problem: Coping: Goal: Psychosocial and spiritual needs will be supported Outcome: Progressing   Problem: Respiratory: Goal: Will maintain a patent airway Outcome: Progressing Goal: Complications related to the disease process, condition or treatment will be avoided or minimized Outcome: Progressing

## 2020-05-12 NOTE — Progress Notes (Signed)
[facility] Progress Note   44 y.o.malewith a history of renal failure currently on his 3rd transplant from [facility]) with the 1st and 3rd being DDRT and the 2nd a living transplant from his ex-wife. He was on dialysis since age 58 and also between the transplants with the the 2nd one in 2006 and the current one 5 years ago.  He is  on prednisone 5mg , prograf 3mg  BID, and was also started on Bumex when he was retaining fluid. 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 Here with Covid.  Assessment/ Plan:   1) AKI on CKDIV with a kidney transplant #3 from [facility]) followed by Dr. Dolores Frame. Underlying disease: Alports Case has been discussed w/  the oncall nephrologist paged when he was 1st admitted. His BL creatinine was already in the mid 3's. He was started on Bumex bec of fluid  retention. Doesn't appear that he's on Imuran or Cellcept and I wonder if he's had CMV or polyoma. -discussed his current labs and that he will be needing to start dialysis here sooner rather than later. At this point, he is resistant on starting and wants to leave to go drive back to [place]. Discussed risks of such decisions and regardless he said he's going to head out today. Discussed with primary service. -constellations of signs and symptoms not concerning for anti-GBM disease in the context of underlying Alports with transplant - Strict I&O's with only floor weights.. - Continue steroids and Prograf; requested  a tac level 12/8 (pending). Goal tac level 6-8 - Transplant renal ultrasound -> no obstruction.  2. Hyperkalemia, improved: few reasons for this: AKI on CKD and uncontrolled sugars. 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 Monitor for now, DM control per primary, on sodium bicarb 3. Metabolic acidosis, secondary to AKI liekly. Increasing nahco3 1300mg  to TID 4. Renal osteodystrophy - will check a phos but  he's not really eating much. 5. Anemia - Transfuse as needed; no IV iron with active infection. 6. COVID PNA - per primary team. 7.H/o AVR 8.Access: AVF, possibly has high output heart failure. There was a possibilty of ligation however his outpatient nephrologist was hesitant about this given    Subjective:   Still loss of appetite but otherwise no complaints. Denies n/v, dysgeusia, SOB, swelling, changes in urinary frequency. Patient reports that he is leaving today to go back to [place] and will follow with his physicians over there.   Objective:   BP (!) 148/47 (BP Location: Left Leg)   Pulse (!) 57   Temp 97.6 F (36.4 C) (Oral)   Resp 18   Ht 6' (1.829 m)   Wt 68.2 kg   SpO2 95%   BMI 20.39 kg/m   Intake/Output Summary (Last 24 hours) at 05/12/2020 1420 Last data filed at 05/12/2020 0600 Gross per 24 hour  Intake 480 ml  Output 300 ml  Net 180 ml   Weight change:   Physical Exam: GEN: NAD, A&Ox3, NCAT HEENT: No conjunctival pallor, EOMI NECK: Supple, no thyromegaly LUNGS: rhonchi, no wheezing CV: RRR, No rubs or gallops ABD: SNDNT +BS  EXT:  no edema ACCESS: LUA AVG + thrill  Imaging: No results found.  Labs: BMET Recent Labs  Lab 05/09/20 0952 05/10/20 0224 05/10/20 0600 05/10/20 1456 05/11/20 0054 05/11/20 0549 05/12/20 0101  NA 140 137 137 138 134* 135 134*  K 5.0 >7.5* 6.1* 6.4* 6.9* 5.7* 5.2*  CL 112* 111 110 107 106 105 102  CO2 15* 15* 17* 19* 18* 19* 19*  GLUCOSE 103* 91 107* 117* 187* 311* 341*  BUN 90* 90* 95* 97* 99* 100* 121*  CREATININE 3.73* 4.11* 4.01* 4.27* 4.56* 4.97* 5.18*  CALCIUM 7.3* 6.5* 6.4* 6.8* 6.8* 6.9* 6.6*  PHOS  --   --  5.8*  --   --   --   --    CBC Recent Labs  Lab 05/09/20 0952 05/10/20 0224 05/11/20 0054 05/12/20 0101  WBC 5.2 4.8 3.7* 3.2*  NEUTROABS 3.7 3.3 3.1 2.7  HGB 7.1* 7.9* 8.2* 7.5*  HCT 22.2* 23.8* 25.8* 23.1*  MCV 96.1 91.9 93.1 90.6  PLT 106* 93* 96* 109*    Medications:    . albuterol  10  mg Nebulization Once  . aspirin EC  81 mg Oral Daily  . atorvastatin  40 mg Oral Daily  . calcitRIOL  0.5 mcg Oral Daily  . carvedilol  12.5 mg Oral BID WC  . dexamethasone  6 mg Oral Q24H  . insulin aspart  0-9 Units Subcutaneous Q4H  . insulin aspart  7 Units Subcutaneous TID WC  . levETIRAcetam  500 mg Oral BID  . ondansetron (ZOFRAN) IV  4 mg Intravenous Q6H  . pantoprazole  20 mg Oral Daily  . sodium bicarbonate  1,300 mg Oral BID  . tacrolimus  3 mg Oral BID  . Warfarin - Pharmacist Dosing Inpatient   Does not apply [id number]

## 2020-05-12 NOTE — Progress Notes (Signed)
Subjective:   Hospital day: 3  Overnight event: NAEOV  This AM, patient's K+ improved to 5.2. Patient was laying comfortable in bed this morning. He denied any SOB or pain. Patient states he is ready to go back to [facility] today. States he wants to drive back home so he can follow up with his doctors there. States he has a history of hyperkalemia and everything he is hospitalized, they are not able to get his K+ right. Team informed patient that from a respiratory standpoint he will be ready for discharge but we are very concern about his renal function and elevated potassium. Team informed patient that we would talk to nephrology to get their recommendation.   Disposition: Leaving Against Medical Advice Patient expressed the desire to leave to go back to [place] today. After primary team and nephrology expressed our concerns that it is not safe to leave with his electrolyte disturbances and worsening renal function, he still wants to leave today. Patient was called to verify leaving AMA again and he had not changed his mind. He stated that he has call his doctor and set up an appointment with him. I informed patient that the risk of leaving AMA includes worsening electrolyte electrolyte disturbance which can cause an arrhythmia and possibly death. The benefits of staying includes allowing him to stabilize his renal function through dialysis. I also informed him that we strongly advice against driving as this can put others in danger. Patient expressed understanding and still wanted to leave. I asked RN to give patient AMA papers to sign. Patient will leave with 6 packets of Lokelma 10 mg.   Objective:  Vital signs in last 24 hours: Vitals:   05/10/20 2113 05/11/20 0513 05/11/20 1515 05/11/20 2107  BP: (!) 155/65 (!) 149/70 (!) 150/58 (!) 154/63  Pulse: 77 63 70 73  Resp: 17 18  18   Temp: 98.4 F (36.9 C) (!) 97.3 F (36.3 C) (!) 97.2 F (36.2 C) 98 F (36.7 C)  TempSrc: Oral  Oral Oral Oral  SpO2: 97% 100% 93% 94%  Weight:      Height:        Physical Exam  General:Pleasant, mildly cachetic middle-age laying in bed. No acute distress. Appears older than stated age.  Head:Normocephalic. Atraumatic. HEENT: Sensorineural hearing loss. Has hearing aid.  CV: RRR.No rubs, or gallops. Systolic flow murmur with click. No LE edema Pulmonary:Off O2. Lungs CTAB. Normal effort. No wheezing or rales. Abdominal:Soft,nontender,nondistended. Normal bowel sounds. Extremities: Palpable pulses. Normal ROM. Skin:Warm and dry. No obvious rash or lesions. Neuro:A&Ox3.Moves all extremities. Normal sensation. No focal deficit. Psych: Anxious to leave.   Assessment/Plan: Wesley Bell is a 44 y.o. 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  Principal Problem:   Pneumonia due to COVID-19 virus  #Acute respiratory failure 2/2 COVID pneumonia Patient w/ dyspnea, body aches, cough, fever/chills and found to be in acute hypoxic respiratory failure, currently requiring 4L oxygen. Inflammatory markers elevated at CRP 5.2, D-dimer 0.67, ferritin 449, LDH 258. Pro-calcitonin of 1.68. Normal lactic acid, fibrinogen, trig. Vaccinated with J&J vaccine. COVID+ on 12/07. CXR shows bilateral pulmonary opacities. D-dimer now normal, CRP trending down, ferritin trending up slightly. Now on RA with O2 sats. Patient wants to leave today. --Patient  leaving against medical advice. Patient was informed to isolate for 21 days from first day of symptoms. He expressed understanding of this.   #ESRD s/p kidney transplant x3 Secondary to Alport syndrome. Patient received kidney transplant in 2001 & 2006 and both failed. His third  transplant in 2016 has successful so far. Baseline cr around 3. Found to have BUN of 90 and creat of 3.73. Renal U/S shows right transplant kidney appears normal and atrophic native kidneys. K+ improved to 5.2. Calcium down to 6.6. Worsening renal function w/ Cr of 5.18, BUN 121. Patient wants to hold off dialysis. Patient wants to leave today against medical advice.  --Nephro recommending HD but patient refuses --Discharging home with 3 days of Lokelma 10 mg BID --Pending tac levels --Patient leaving AMA but plans to follow up with his doctors in [place]. --Advised patient against driving due to high risk of hyperkalemia leading to an arrhythmia.   Hyperglycemia No hx of diabetes. Currently on steroids for covid infection. CBG elevated to the 300s today. Started on meal coverage but patient leaving AMA.   #mechanical AVR on Coumadin Patient states he had his aortic valve replaced in 2012. He is currently on warfarin and takes 7.5 mg on Monday, Wednesday and Saturday then 3 mg the rest of the week. PT/INR improved to 37.7/4.0 today. Patient leaving AMA. Instructed to hold warfarin for now and get INR recheck in [place].   #HTN Patient was normotensive on arrival. BP continues to rise since admission. BP slightly elevated to 148/47 this AM. Continue to monitor. Continued home Coreg 12.5 mg twice daily  #Seizure Patient with a history of seizure on Keppra. Stable Continued Keppra 500 mg twice daily  #Pain Patient endorses mild chest discomfort and generalized pain. Patient is currently pain-free.  CODE STATUS: Full Code DIET: Regular PPx: Warfarin dosed by pharmacy  Prior to Admission Living Arrangement: Home Anticipated Discharge Location: Home Barriers to Discharge: Medical work up Dispo: Anticipated discharge in approximately 1-2 day(s). Leaving AMA.   Signed: Lacinda Axon, MD 05/12/2020, 6:22 AM  Pager: [phone] Internal Medicine Teaching  Service After 5pm on weekdays and 1pm on weekends: On Call pager: [phone]

## 2020-05-12 NOTE — Progress Notes (Signed)
Inpatient Diabetes Program Recommendations  AACE/ADA: New Consensus Statement on Inpatient Glycemic Control (2015)  Target Ranges:  Prepandial:   less than 140 mg/dL      Peak postprandial:   less than 180 mg/dL (1-2 hours)      Critically ill patients:  140 - 180 mg/dL   No results found for: GLUCAP, HGBA1C  Review of Glycemic Control Results for [name], Wesley Bell (MRN [id number]) as of 05/12/2020 10:05  Ref. Range 05/11/2020 00:54 05/11/2020 03:15 05/11/2020 03:20 05/11/2020 05:49 05/12/2020 01:01  Glucose Latest Ref Range: 70 - 99 mg/dL 187 (H)   311 (H) 341 (H)   Diabetes history: no DM Outpatient Diabetes medications: none Current orders for Inpatient glycemic control: none Decadron 6 mg QD  Inpatient Diabetes Program Recommendations:    Noted Am serum was 341 mg/dL. May want to consider in the setting of steroids/coivd to check CBGs TID. If CBGs remain above inpatient goals of 180 mg/dL, -Add Novolog 0-9 units TID + HS 0-5 units. Secure chat sent to Dr. Heber [place].  Thank you, Nani Gasser. Jarreau Callanan, RN, MSN, CDE  Diabetes Coordinator Inpatient Glycemic Control Team Team Pager [phone] (8am-5pm) 05/12/2020 10:06 AM

## 2020-05-12 NOTE — TOC Initial Note (Signed)
Transition of Care [facility]) - Initial/Assessment Note    Patient Details  Name: Wesley Bell MRN: [id number] Date of Birth: 1976/05/25  [facility]) CM/SW Contact:    Verdell Carmine, RN Phone Number: 05/12/2020, 8:41 AM  Clinical Narrative:                 Patient presents with COVID, renal failure, has had 3 transplants, kidney, Lives in [place]. Insured there  Wants to hold off on dialysis if possible As Medicaid is out of State connot be used for medications, attempted to call patient in room phone and on cell,no answer will call back later time to assess if patient just passing through or taking up residence here in [place].  Expected Discharge Plan: Home/Self Care Barriers to Discharge: Continued Medical Work up   Patient Goals and CMS Choice        Expected Discharge Plan and Services Expected Discharge Plan: Home/Self Care   Discharge Planning Services: CM Consult                                          Prior Living Arrangements/Services   Lives with:: Self Patient language and need for interpreter reviewed:: Yes        Need for Family Participation in Patient Care: Yes (Comment)     Criminal Activity/Legal Involvement Pertinent to Current Situation/Hospitalization: No - Comment as needed  Activities of Daily Living Home Assistive Devices/Equipment: None ADL Screening (condition at time of admission) Patient's cognitive ability adequate to safely complete daily activities?: Yes Is the patient deaf or have difficulty hearing?: Yes Does the patient have difficulty seeing, even when wearing glasses/contacts?: No Does the patient have difficulty concentrating, remembering, or making decisions?: No Patient able to express need for assistance with ADLs?: Yes Does the patient have difficulty dressing or bathing?: No Independently performs ADLs?: Yes (appropriate for developmental age) Does the patient have difficulty walking or climbing stairs?:  No Weakness of Legs: None Weakness of Arms/Hands: None  Permission Sought/Granted                  Emotional Assessment       Orientation: : Oriented to Self,Oriented to Place,Oriented to  Time,Oriented to Situation Alcohol / Substance Use: Not Applicable Psych Involvement: No (comment)  Admission diagnosis:  SOB (shortness of breath) [R06.02] Renal failure (ARF), acute on chronic (HCC) [N17.9, N18.9] Anemia, unspecified type [D64.9] Pneumonia due to COVID-19 virus [U07.1, J12.82] Patient Active Problem List   Diagnosis Date Noted  . Pneumonia due to COVID-19 virus 05/09/2020   PCP:  Pcp, No Pharmacy:   [place], [address] [place] [address] Phone: [phone] Fax: [phone]     Social Determinants of Health (SDOH) Interventions    Readmission Risk Interventions No flowsheet data found.

## 2020-05-12 NOTE — Progress Notes (Signed)
[place] for warfarin Indication: Mechanical AVR  Allergies  Allergen Reactions  . Epimedium Itching  . Zosyn [Piperacillin Sod-Tazobactam So] Itching        Labs: Recent Labs    05/10/20 0224 05/10/20 0600 05/11/20 0054 05/11/20 0549 05/12/20 0101  HGB 7.9*  --  8.2*  --  7.5*  HCT 23.8*  --  25.8*  --  23.1*  PLT 93*  --  96*  --  109*  LABPROT 41.2*  --  46.2*  --  37.7*  INR 4.5*  --  5.2*  --  4.0*  CREATININE 4.11*   < > 4.56* 4.97* 5.18*   < > = values in this interval not displayed.    Estimated Creatinine Clearance: 17.7 mL/min (A) (by C-G formula based on SCr of 5.18 mg/dL (H)).   Assessment: 31 YOM with SOB secondary to COVID-19 on warfarin PTA for h/o of mechanical AVR. Pharmacy consulted to resume warfarin therapy.   INR supratherapeutic  Goal of Therapy:  INR 2-3 Monitor platelets by anticoagulation protocol: Yes   Plan:  Continue to hold warfarin Daily INR  Thank you Anette Guarneri, PharmD  Please refer to [facility] for unit-specific pharmacist

## 2020-05-13 NOTE — Discharge Summary (Signed)
Name: Wesley Bell MRN: [id number] DOB: Oct 21, 1975 44 y.o. PCP: Pcp, No  Date of Admission: 05/09/2020  9:42 AM Date of Discharge: 05/12/2020 Attending Physician: Dr. Angelia Mould  Discharge Diagnosis: Principal Problem:   Pneumonia due to COVID-19 virus    Discharge Medications: Allergies as of 05/12/2020      Reactions   Epimedium Itching   Zosyn [piperacillin Sod-tazobactam So] Itching         Medication List    TAKE these medications   Lokelma 10 g Pack packet Generic drug: sodium zirconium cyclosilicate Take 10 g by mouth 2 (two) times daily for 3 days.     ASK your doctor about these medications   allopurinol 100 MG tablet Commonly known as: ZYLOPRIM Take 50 mg by mouth daily.   Aspirin Low Dose 81 MG EC tablet Generic drug: aspirin Take 81 mg by mouth daily.   atorvastatin 40 MG tablet Commonly known as: LIPITOR Take 40 mg by mouth at bedtime.   bumetanide 1 MG tablet Commonly known as: BUMEX Take 1 mg by mouth 2 (two) times daily.   calcitRIOL 0.5 MCG capsule Commonly known as: ROCALTROL Take 0.5 mcg by mouth daily.   Calcium Antacid 500 MG chewable tablet Generic drug: calcium carbonate Chew 3 tablets by mouth 3 (three) times daily as needed for indigestion or heartburn.   carvedilol 25 MG tablet Commonly known as: COREG Take 25 mg by mouth 2 (two) times daily with a meal.   lansoprazole 30 MG capsule Commonly known as: PREVACID Take 30 mg by mouth daily before breakfast.   levETIRAcetam 500 MG tablet Commonly known as: KEPPRA Take 500 mg by mouth 2 (two) times daily.   predniSONE 5 MG tablet Commonly known as: DELTASONE Take 5 mg by mouth daily.   tacrolimus 1 MG capsule Commonly known as: PROGRAF Take 3 mg by mouth 2 (two) times daily.   Vitamin D (Ergocalciferol) 1.25 MG (50000 UNIT) Caps capsule Commonly known as: DRISDOL Take 50,000 Units by mouth every 7 (seven) days.   warfarin 7.5 MG tablet Commonly known as:  COUMADIN Take 7.5 mg by mouth See admin instructions. Take 7.5 mg by mouth at bedtime on Mon/Wed/Sat   warfarin 3 MG tablet Commonly known as: COUMADIN Take 3 mg by mouth See admin instructions. Take 3 mg by mouth at bedtime on Sun/Tues/Thurs/Fri       Disposition and follow-up:   Mr.Wesley Bell was discharged from [facility] in [place] condition.  At the hospital follow up visit please address:  1.  Follow-up:  A. Covid symptoms: Patient's breathing was back to baseline when he left the hospital. Please ensure he remains symptom-free and is isolating appropriately.     B. Electrolytes disturbances: Patient has hyperkalemia and hypocalcemia during admission. These were being addressed when patient left AMA. Please monitor with repeat BMP.   C. ESRD: Nephrology recommended hemodialysis but patient refused. Consider revisiting the conversation with him if kidney function continues to worsen.   2.  Labs / imaging needed at time of follow-up: BMP, CBC  3.  Pending labs/ test needing follow-up: None  Follow-up Appointments: He will follow up with his doctor in [facility] Course by problem list: Acute respiratory failure 2/2 COVID pneumonia Patient presented w/ dyspnea, body aches, cough, fever/chills and found to be in acute hypoxic respiratory failure due to a positive covid test requiring 4L oxygen. Inflammatory markers was elevated at CRP 5.2, D-dimer 0.67, ferritin 449, LDH 258.  Pro-calcitonin of 1.68. Normal lactic acid, fibrinogen, trig. Vaccinated with J&J vaccine. COVID+ on 12/07. CXR showed bilateral pulmonary opacities. D-dimer improved to wnl. CRP trending down, ferritin trending up slightly. He was able to maintain appropriate O2 sats on room air  2 days after admission. Patient completed 4 days of Remdesivir and dexamethasone. Patient wanted to leave against medical advice. Patient was informed to isolate for 21 days from first day of symptoms. He  expressed understanding of this and left AMA.    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 His third transplant in 2016 has successful so far. Baseline cr around 3. Found to have BUN of 90 and creat of 3.73. Renal U/S showed right transplant kidney appears normal and atrophic native kidneys. His hospitalization was complicated by hyperkalemia of >7.5 and hypocalcemia of 6.4. They were both treated and K+ improved 5.2 and calcium improved to 6.6. He continued to have worsening renal function w/ Cr of 5.18, BUN 121 at day of discharge. Nephrology recommended hemodialysis but patient wanted to hold off dialysis. Patient left hospital against medical advice because he wanted to get back home to [place]. He was sent home with 3 days of Lokelma 10 mg twice daily. His tacrolimus levels were still pending. Advised patient against driving due to high risk of hyperkalemia leading to an arrhythmia.    Hyperglycemia No hx of diabetes. Developed hyperglycemia while on steroids for covid infection. CBG elevated to as high as 311. Started on meal coverage insulin but patient leaving AMA.    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 He is currently on warfarin and takes 7.5 mg on Monday, Wednesday and Saturday then 3 mg the rest of the week. PT/INR was 40.4/4.4 on admission. Pharmacy was consulted to manage his warfarin. Patient decided to leave AMA. PT/INR was 37.7/4.0 on day patient left. Instructed to hold warfarin for now and get INR recheck in [place].    HTN Patient was normotensive on arrival. BP continues to rise since admission. We continued home Coreg 12.5 mg twice daily. BP slightly elevated to 158/93 on the day the patient left.        Discharge Vitals:   BP (!) 148/47 (BP Location: Left Leg)   Pulse (!) 57   Temp 97.6 F (36.4 C) (Oral)   Resp 18    Ht 6' (1.829 m)   Wt 68.2 kg   SpO2 95%   BMI 20.39 kg/m   Pertinent Labs, Studies, and Procedures:  CBC Latest Ref Rng & Units 05/12/2020 05/11/2020 05/10/2020  WBC 4.0 - 10.5 K/uL 3.2(L) 3.7(L) 4.8  Hemoglobin 13.0 - 17.0 g/dL 7.5(L) 8.2(L) 7.9(L)  Hematocrit 39.0 - 52.0 % 23.1(L) 25.8(L) 23.8(L)  Platelets 150 - 400 K/uL 109(L) 96(L) 93(L)    CMP Latest Ref Rng & Units 05/12/2020 05/11/2020 05/11/2020  Glucose 70 - 99 mg/dL 341(H) 311(H) 187(H)  BUN 6 - 20 mg/dL 121(H) 100(H) 99(H)  Creatinine 0.61 - 1.24 mg/dL 5.18(H) 4.97(H) 4.56(H)  Sodium 135 - 145 mmol/L 134(L) 135 134(L)  Potassium 3.5 - 5.1 mmol/L 5.2(H) 5.7(H) 6.9(HH)  Chloride 98 - 111 mmol/L 102 105 106  CO2 22 - 32 mmol/L 19(L) 19(L) 18(L)  Calcium 8.9 - 10.3 mg/dL 6.6(L) 6.9(L) 6.8(L)  Total Protein 6.5 - 8.1 g/dL 5.2(L) - 5.7(L)  Total Bilirubin 0.3 - 1.2 mg/dL 1.0 - 1.1  Alkaline Phos 38 - 126 U/L 31(L) - 33(L)  AST 15 - 41 U/L 16 - 20  ALT 0 - 44 U/L 12 - 16    DG Chest 1 View  Result Date: 05/09/2020 CLINICAL DATA:  Shortness of breath, weakness, cough. EXAM: CHEST  1 VIEW COMPARISON:  None. FINDINGS: Bilateral patchy pulmonary opacities. No pneumothorax or pleural effusion. Postsurgical appearance of an enlarged cardiac silhouette. No acute osseous abnormality. Radiodensity overlying the right upper lung may reflect a vascular graft. IMPRESSION: Bilateral pulmonary opacities concerning for infection. Cardiomegaly. Electronically Signed   By: Primitivo Gauze M.D.   On: 05/09/2020 11:07   US RENAL  Result Date: 05/09/2020 CLINICAL DATA:  Acute renal failure.  COVID. EXAM: RENAL / URINARY TRACT ULTRASOUND COMPLETE COMPARISON:  None. FINDINGS: Right Kidney: Renal measurements: 8.6 x 3.5 x 3.4 cm = volume: 53 mL. Atrophic and echogenic. Left Kidney: Renal measurements: 11.7 x 4.1 x 3.1 cm = volume: 81 mL. Atrophic and echogenic. Anechoic cystic lesion extending from lower pole of the LEFT kidney. Transplant kidney  RIGHT lower quadrant: 11.5 x 4.1 x 5.4 cm.  No hydronephrosis. Bladder: Appears normal for degree of bladder distention. Other: None. IMPRESSION: 1. Transplant kidney in the RIGHT lower quadrant appears normal. 2. Atrophic native kidneys. Electronically Signed   By: Suzy Bouchard M.D.   On: 05/09/2020 19:08     Discharge Instructions:   Signed: Lacinda Axon, MD 05/13/2020, 9:31 PM   Pager: [phone]

## 2020-05-13 NOTE — Hospital Course (Signed)
Acute respiratory failure 2/2 COVID pneumonia Patient presented w/ dyspnea, body aches, cough, fever/chills and found to be in acute hypoxic respiratory failure due to a positive covid test requiring 4L oxygen. Inflammatory markers was elevated at CRP 5.2, D-dimer 0.67, ferritin 449, LDH 258. Pro-calcitonin of 1.68. Normal lactic acid, fibrinogen, trig. Vaccinated with J&J vaccine. COVID+ on 12/07. CXR showed bilateral pulmonary opacities. D-dimer improved to wnl. CRP trending down, ferritin trending up slightly. He was able to maintain appropriate O2 sats on room air  2 days after admission. Patient completed 4 days of Remdesivir and dexamethasone. Patient wanted to leave against medical advice. Patient was informed to isolate for 21 days from first day of symptoms. He expressed understanding of this and left AMA.    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 His third transplant in 2016 has successful so far. Baseline cr around 3. Found to have BUN of 90 and creat of 3.73. Renal U/S showed right transplant kidney appears normal and atrophic native kidneys. His hospitalization was complicated by hyperkalemia of >7.5 and hypocalcemia of 6.4. They were both treated and K+ improved 5.2 and calcium improved to 6.6. He continued to have worsening renal function w/ Cr of 5.18, BUN 121 at day of discharge. Nephrology recommended hemodialysis but patient wanted to hold off dialysis. Patient left hospital against medical advice because he wanted to get back home to [place]. He was sent home with 3 days of Lokelma 10 mg twice daily. His tacrolimus levels were still pending. Advised patient against driving due to high risk of hyperkalemia leading to an arrhythmia.    Hyperglycemia No hx of diabetes. Developed hyperglycemia while on steroids for covid infection. CBG elevated to as high as 311. Started on meal coverage  insulin but patient leaving AMA.    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 He is currently on warfarin and takes 7.5 mg on Monday, Wednesday and Saturday then 3 mg the rest of the week. PT/INR was 40.4/4.4 on admission. Pharmacy was consulted to manage his warfarin. Patient decided to leave AMA. PT/INR was 37.7/4.0 on day patient left. Instructed to hold warfarin for now and get INR recheck in [place].    HTN Patient was normotensive on arrival. BP continues to rise since admission. We continued home Coreg 12.5 mg twice daily. BP slightly elevated to 158/93 on the day the patient left.

## 2020-05-14 LAB — CULTURE, BLOOD (ROUTINE X 2)
Culture: NO GROWTH
Culture: NO GROWTH
Special Requests: ADEQUATE
Special Requests: ADEQUATE

## 2020-05-15 LAB — TACROLIMUS LEVEL: Tacrolimus (FK506) - LabCorp: 3.7 ng/mL (ref 2.0–20.0)

## 2021-08-17 IMAGING — CR DG CHEST 1V
1 series · 1 of 1 positions shown · non-contrast
Comparison: None.

CLINICAL DATA: Shortness of breath, weakness, cough.

EXAM:
CHEST  1 VIEW

[AP]
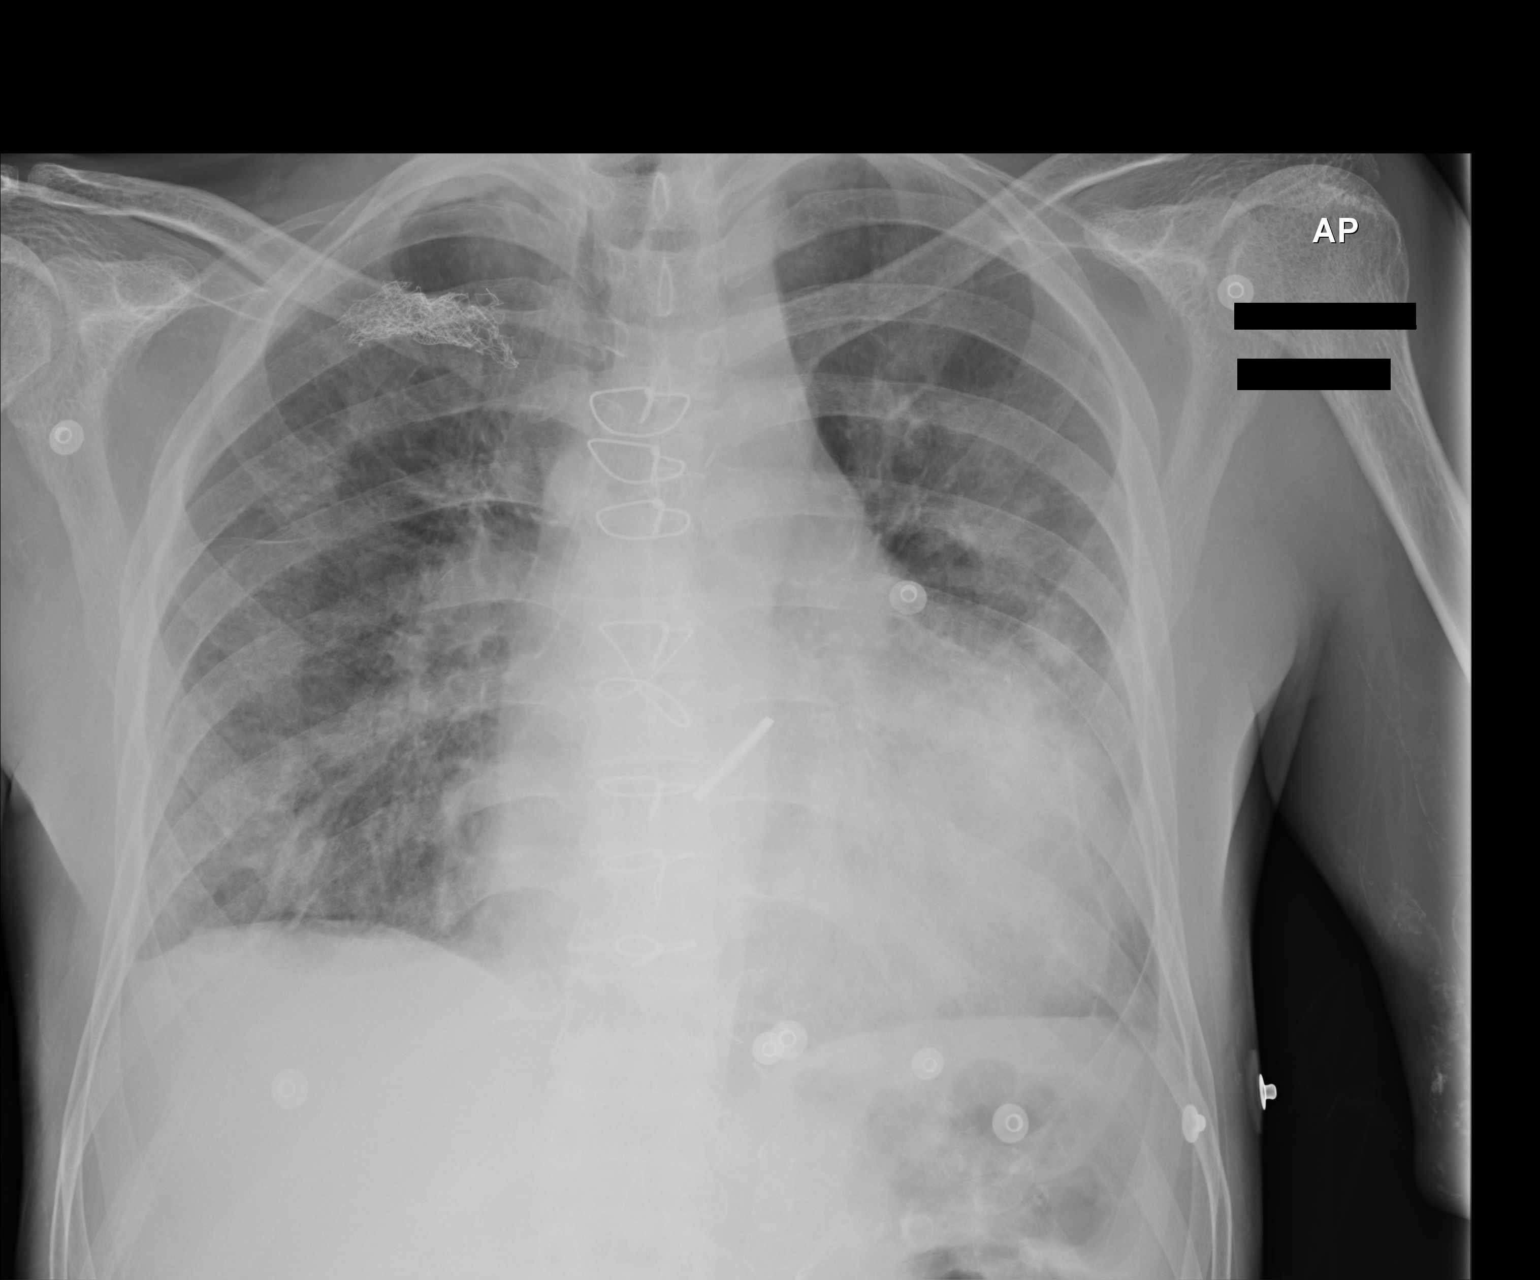

[1 of 1 positions shown; findings below may reference images not displayed]

FINDINGS: Bilateral patchy pulmonary opacities. No pneumothorax or pleural
effusion. Postsurgical appearance of an enlarged cardiac silhouette.
No acute osseous abnormality. Radiodensity overlying the right upper
lung may reflect a vascular graft.
IMPRESSION: Bilateral pulmonary opacities concerning for infection.

Cardiomegaly.

## 2021-08-17 IMAGING — US US RENAL
1 series · 14 of 25 positions shown · non-contrast
Comparison: None.

CLINICAL DATA: Acute renal failure.  COVID.

EXAM:
RENAL / URINARY TRACT ULTRASOUND COMPLETE

[Series 1: us renal · 14 of 41 slices shown]
[im 1/41]
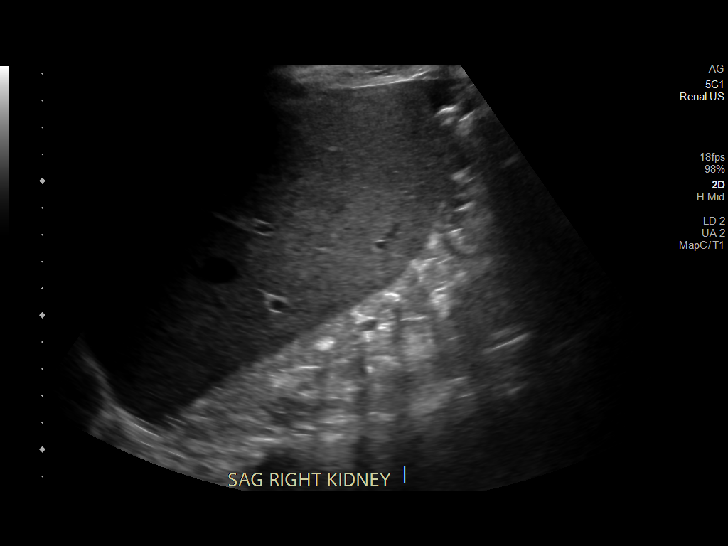
[im 4/41]
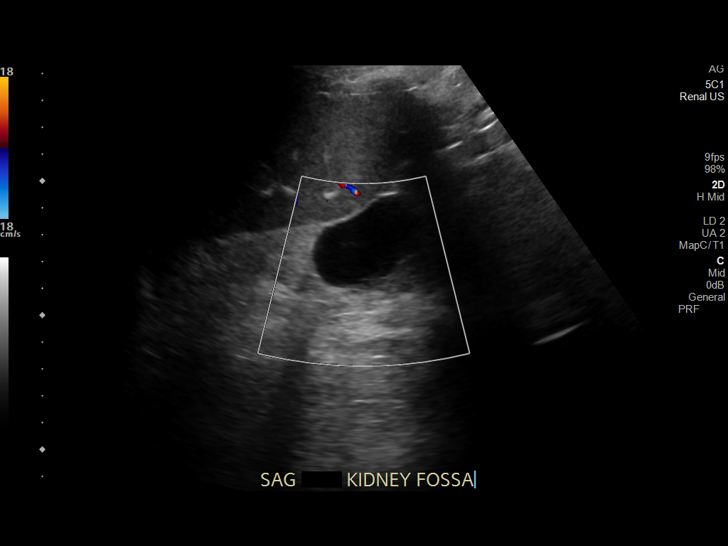
[im 7/41]
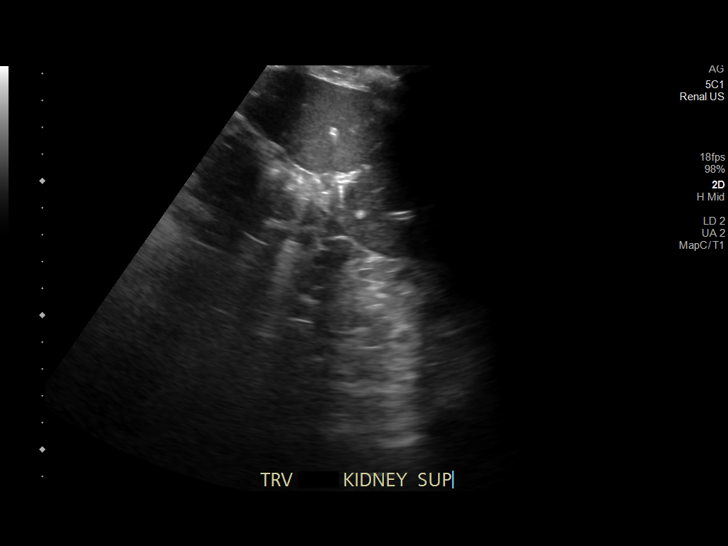
[im 11/41]
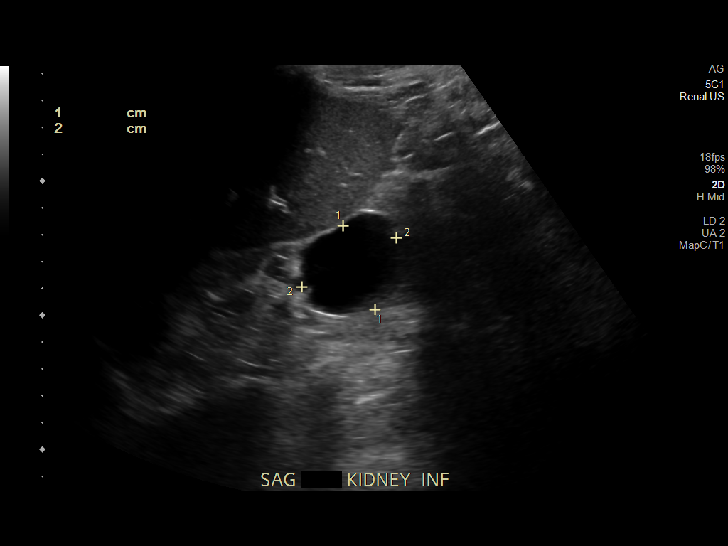
[im 14/41]
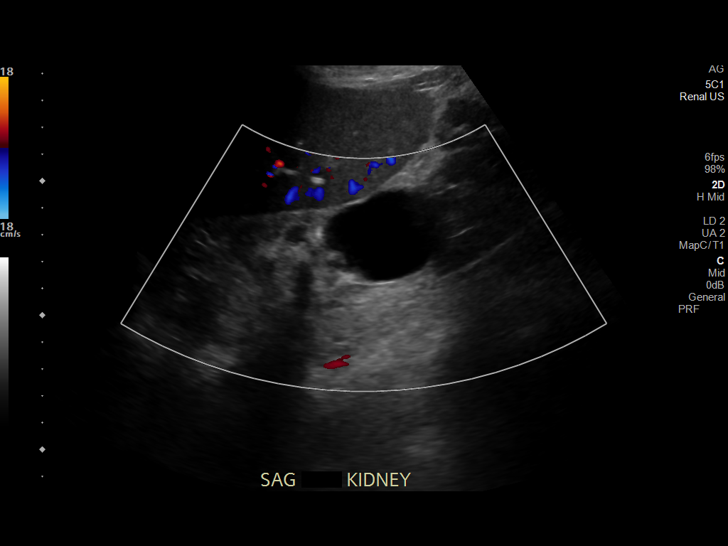
[im 16/41]
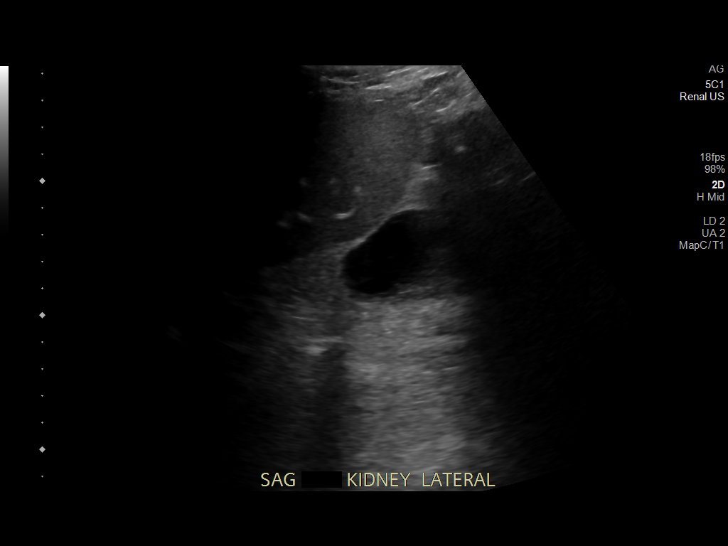
[im 19/41]
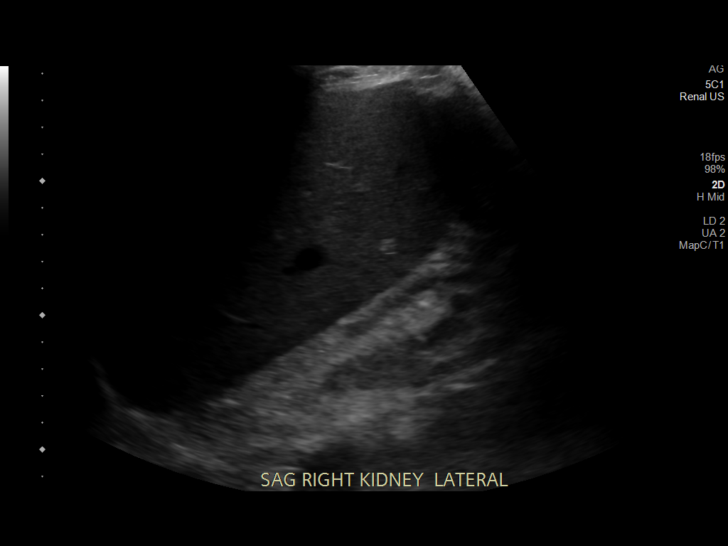
[im 22/41]
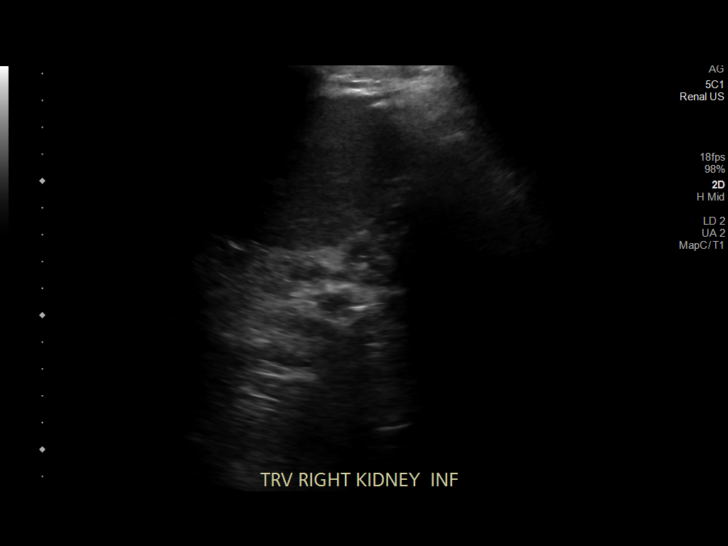
[im 26/41]
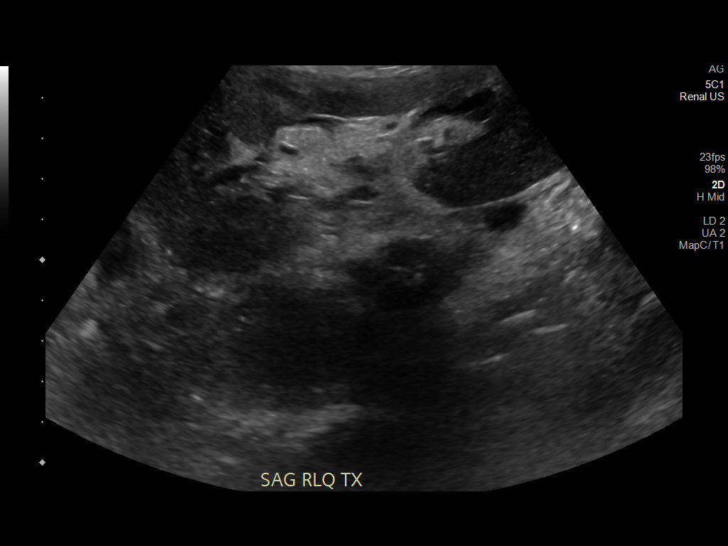
[im 27/41]
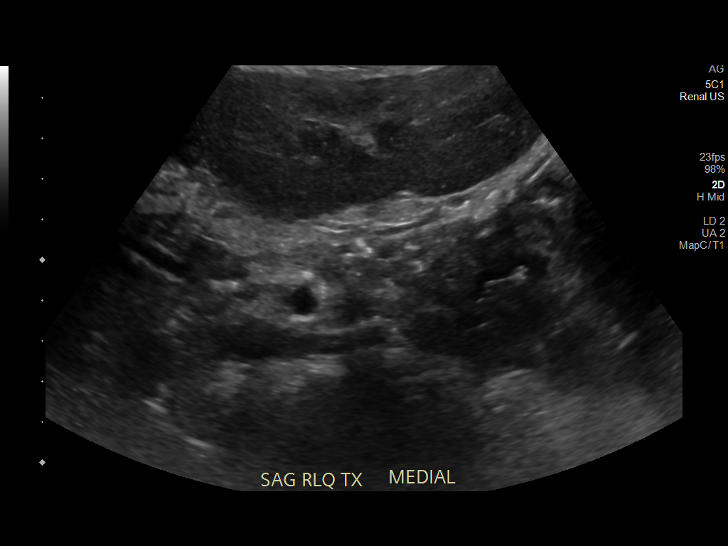
[im 31/41]
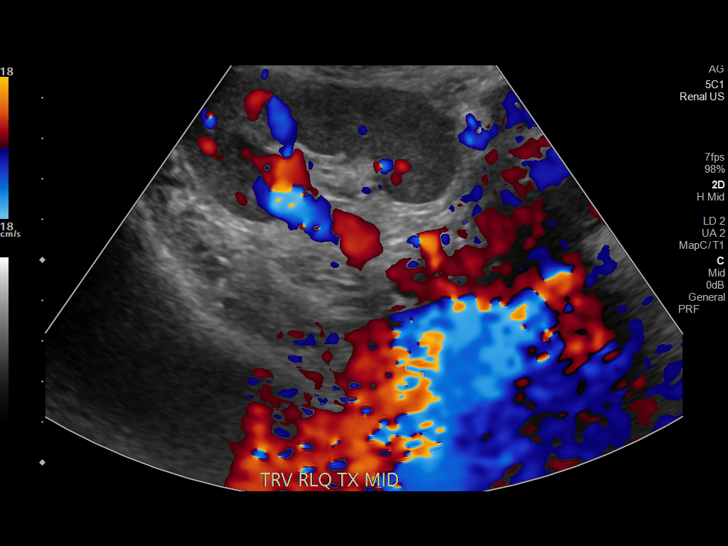
[im 34/41]
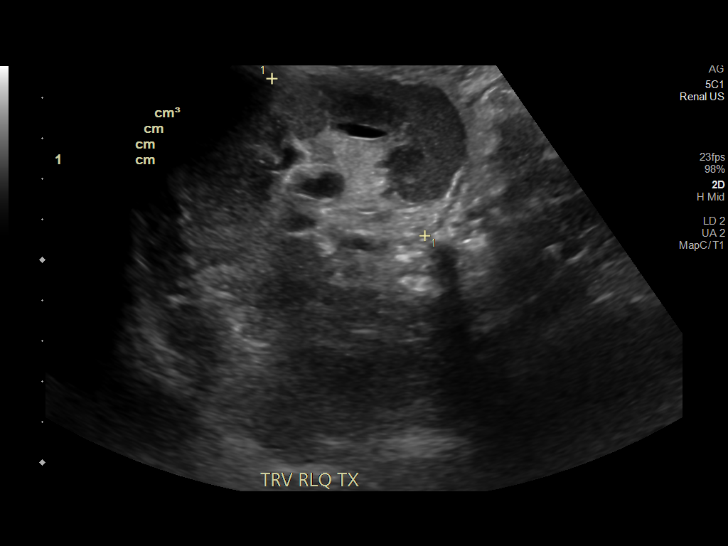
[im 37/41]
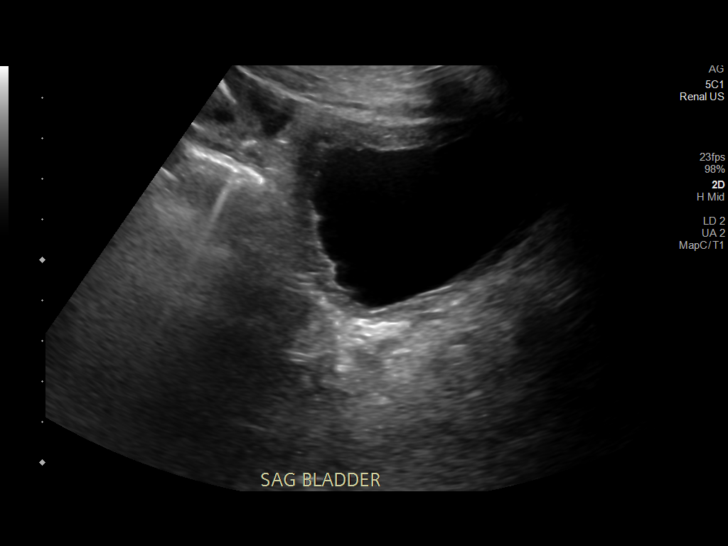
[im 41/41]
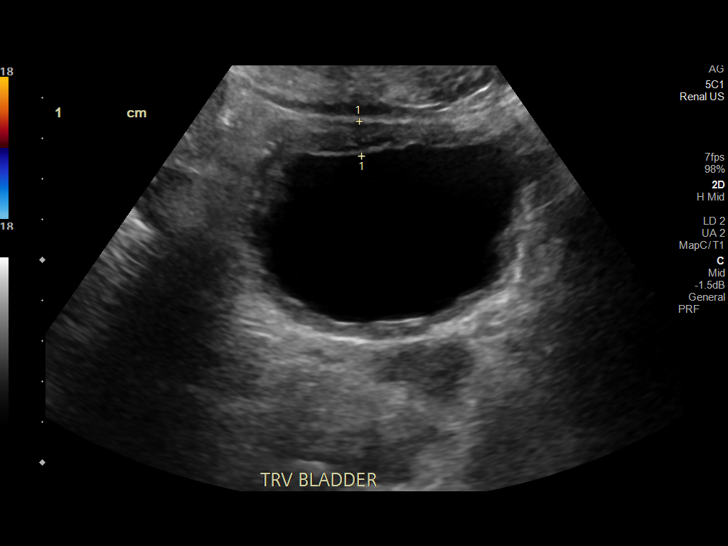

[14 of 25 positions shown; findings below may reference images not displayed]

FINDINGS: Right Kidney:

Renal measurements: 8.6 x 3.5 x 3.4 cm = volume: 53 mL. Atrophic and
echogenic.

Left Kidney:

Renal measurements: 11.7 x 4.1 x 3.1 cm = volume: 81 mL. Atrophic
and echogenic. Anechoic cystic lesion extending from lower pole of
the LEFT kidney.

Transplant kidney RIGHT lower quadrant:

11.5 x 4.1 x 5.4 cm.  No hydronephrosis.

Bladder:

Appears normal for degree of bladder distention.

Other:

None.
IMPRESSION: 1. Transplant kidney in the RIGHT lower quadrant appears normal.
2. Atrophic native kidneys.
